# Patient Record
Sex: Male | Born: 1965
Health system: Southern US, Community
[De-identification: ages and names within clinical notes are randomized; demographics above are authoritative.]

## PROBLEM LIST (undated history)

## (undated) DIAGNOSIS — M109 Gout, unspecified: Secondary | ICD-10-CM

## (undated) HISTORY — PX: TONSILLECTOMY: SUR1361

## (undated) HISTORY — PX: TOOTH EXTRACTION: SUR596

---

## 2001-06-05 ENCOUNTER — Encounter (HOSPITAL_COMMUNITY): Admission: RE | Admit: 2001-06-05 | Discharge: 2001-07-05 | Payer: Self-pay | Admitting: Family Medicine

## 2001-07-14 ENCOUNTER — Encounter (HOSPITAL_COMMUNITY): Admission: RE | Admit: 2001-07-14 | Discharge: 2001-08-13 | Payer: Self-pay | Admitting: Family Medicine

## 2010-08-27 ENCOUNTER — Ambulatory Visit (INDEPENDENT_AMBULATORY_CARE_PROVIDER_SITE_OTHER): Payer: BC Managed Care – PPO | Admitting: Internal Medicine

## 2010-08-27 DIAGNOSIS — K625 Hemorrhage of anus and rectum: Secondary | ICD-10-CM

## 2010-09-10 NOTE — Consult Note (Signed)
[name], [name]                ACCOUNT NO.:  [id number]  MEDICAL RECORD NO.:  [id number]           PATIENT TYPE:  LOCATION: [facility]      FACILITY:  PHYSICIAN:  Lionel December, M.D.         DATE OF BIRTH:  DATE OF CONSULTATION: DATE OF DISCHARGE:                                CONSULTATION   REASON FOR CONSULTATION:  Blood in stool.  HISTORY OF PRESENT ILLNESS:  Michael Pruitt is a 45 year old male, referred to our office by Charlann Lange, PA-C at [facility] for blood in stool. He was seen at [facility] on February 29.  He had had rectal bleeding for 3-5 days.  He noticed blood in the toilet which was red. He was started on ProctoCream 2.5% twice a day.  When seen in the office in February, he was guaiac negative.  After starting the medicine, he had not had any rectal bleeding until 4 days ago.  He saw blood in the toilet tissue then.  He denies any heavy lifting.  He has been on a tractor recently.  He usually has a bowel movement 1-4 a day.  His stools are brown in color, normal size.  He continues to use ProctoFoam once a day now.  He denies any rectal fullness. There had been no abdominal pain.  No nausea or vomiting.  No weight loss.  His appetite is good.  No constipation or diarrhea.  There is no family history of colon cancer.  HOME MEDICATIONS: 1. ProctoCream 2.5% once a day. 2. Aleve as needed.  ALLERGIES:  No known allergies.  SURGERIES:  He had a tonsillectomy when he was in the third grade.  MEDICAL PROBLEMS:  Hypertension and high cholesterol.  His mother and father are alive in good health.  One sister in good health.  He is divorced.  He is self-employed as a Visual merchandiser.  He smokes 1- 2 packs a day x15 years.  He drinks 4-5 beers a day.  No children.  OBJECTIVE:  VITAL SIGNS:  His weight is 235, his height is 6 feet, temperature is 97.3, blood pressure is 122/80, pulse is 76. HEENT:  He has natural teeth.  His oral  mucosa is moist.  There are no lesions.  His conjunctivae is pink.  His sclera is anicteric. NECK:  His thyroid is normal.  There is no cervical lymphadenopathy. LUNGS:  Clear. HEART:  Regular rate and rhythm. ABDOMEN:  Obese.  Bowel sounds are positive.  No masses.  ASSESSMENT:  [name] is a 45 year old male, presenting today with a history of rectal bleeding.  He was guaiac negative today in the office. This could be from an internal hemorrhoid bleed, AVM, colonic polyp, or ulcer.  Also, a colonic carcinoma will be in the differential.  RECOMMENDATIONS:  We will schedule a colonoscopy with Dr. Karilyn Cota in the near future and the risk and benefits were reviewed with the patient and he is agreeable.    ______________________________ Dorene Ar, NP   ______________________________ Lionel December, M.D.    TS/MEDQ  D:  08/27/2010  T:  08/28/2010  Job:  [id number]  Electronically Signed by Dorene Ar PA on 09/04/2010 02:38:48 PM Electronically Signed by Lionel December M.D. on 09/09/2010  11:07:04 PM

## 2010-10-11 ENCOUNTER — Encounter (HOSPITAL_BASED_OUTPATIENT_CLINIC_OR_DEPARTMENT_OTHER): Payer: BC Managed Care – PPO | Admitting: Internal Medicine

## 2010-10-11 ENCOUNTER — Ambulatory Visit (HOSPITAL_COMMUNITY)
Admission: RE | Admit: 2010-10-11 | Discharge: 2010-10-11 | Disposition: A | Discharge status: Home / Self Care | Payer: BC Managed Care – PPO | Source: Ambulatory Visit | Attending: Internal Medicine | Admitting: Internal Medicine

## 2010-10-11 DIAGNOSIS — K644 Residual hemorrhoidal skin tags: Secondary | ICD-10-CM

## 2010-10-11 DIAGNOSIS — K5730 Diverticulosis of large intestine without perforation or abscess without bleeding: Secondary | ICD-10-CM | POA: Insufficient documentation

## 2010-10-11 DIAGNOSIS — K921 Melena: Secondary | ICD-10-CM

## 2010-10-11 DIAGNOSIS — Z79899 Other long term (current) drug therapy: Secondary | ICD-10-CM | POA: Insufficient documentation

## 2010-10-11 DIAGNOSIS — I10 Essential (primary) hypertension: Secondary | ICD-10-CM | POA: Insufficient documentation

## 2010-10-11 DIAGNOSIS — E785 Hyperlipidemia, unspecified: Secondary | ICD-10-CM | POA: Insufficient documentation

## 2010-10-28 NOTE — Op Note (Signed)
  NAMESAHAN, Michael Pruitt                ACCOUNT NO.:  [id number]  MEDICAL RECORD NO.:  [id number]           PATIENT TYPE:  O  LOCATION:  [facility]                          FACILITY:  [facility]  PHYSICIAN:  Lionel December, M.D.    DATE OF BIRTH:  Jul 22, 1965  DATE OF PROCEDURE:  10/11/2010 DATE OF DISCHARGE:                              OPERATIVE REPORT   PROCEDURE:  Colonoscopy.  INDICATIONS:  Michael Pruitt is a 45 year old Caucasian male who has been experiencing hematochezia intermittently over the last 3 months.  It is suspected that he may have hemorrhoids.  He is undergoing colonoscopy to be sure that he does not have polyp or other abnormalities to account for his hematochezia.  Procedure and risks were reviewed with the patient and informed consent was obtained.  MEDICATIONS FOR CONSCIOUS SEDATION: 1. Demerol 50 mg IV. 2. Versed 10 mg IV.  FINDINGS:  Procedure performed in endoscopy suite.  The patient's vital signs and O2 sats were monitored during the procedure and remained stable.  The patient was placed in left lateral recumbent position. Rectal examination performed.  No abnormality noted on external or digital exam.  Pentax videoscope was placed through rectum and advanced under vision into sigmoid colon and beyond.  Preparation was excellent. There were 3 small diverticula at sigmoid colon.  Scope was passed into cecum which was identified by appendiceal orifice and ileocecal valve. As the scope was withdrawn, colonic mucosa was carefully examined and was normal throughout.  Rectal mucosa was normal.  Scope was retroflexed to examine anorectal junction and hemorrhoids noted below the dentate line.  Endoscope was withdrawn.  Withdrawal time was 10 minutes.  The patient tolerated the procedure well.  FINAL DIAGNOSES: 1. Examination performed to cecum. 2. External hemorrhoids and 3 small diverticula at sigmoid colon.  RECOMMENDATIONS: 1. High-fiber diet. 2. The patient advised to keep  a written record of bleeding episodes     and is to call us with a progress report in 3 months.  If bleeding     episodes are frequent, he may benefit from Ultroid therapy.  In the     meantime, he can use ProctoCream on p.r.n. basis.          ______________________________ Lionel December, M.D.     NR/MEDQ  D:  10/11/2010  T:  10/12/2010  Job:  [id number]  cc:   Kirk Ruths, M.D. Fax: [phone]  Electronically Signed by Lionel December M.D. on 10/28/2010 01:13:17 PM

## 2012-10-01 ENCOUNTER — Emergency Department (HOSPITAL_COMMUNITY): Payer: BC Managed Care – PPO

## 2012-10-01 ENCOUNTER — Emergency Department (HOSPITAL_COMMUNITY)
Admission: EM | Admit: 2012-10-01 | Discharge: 2012-10-01 | Disposition: A | Discharge status: Home / Self Care | Payer: BC Managed Care – PPO | Attending: Emergency Medicine | Admitting: Emergency Medicine

## 2012-10-01 ENCOUNTER — Encounter (HOSPITAL_COMMUNITY): Payer: Self-pay | Admitting: Emergency Medicine

## 2012-10-01 DIAGNOSIS — S91009A Unspecified open wound, unspecified ankle, initial encounter: Secondary | ICD-10-CM | POA: Insufficient documentation

## 2012-10-01 DIAGNOSIS — W208XXA Other cause of strike by thrown, projected or falling object, initial encounter: Secondary | ICD-10-CM | POA: Insufficient documentation

## 2012-10-01 DIAGNOSIS — S81811A Laceration without foreign body, right lower leg, initial encounter: Secondary | ICD-10-CM

## 2012-10-01 DIAGNOSIS — S81831A Puncture wound without foreign body, right lower leg, initial encounter: Secondary | ICD-10-CM

## 2012-10-01 DIAGNOSIS — F17200 Nicotine dependence, unspecified, uncomplicated: Secondary | ICD-10-CM | POA: Insufficient documentation

## 2012-10-01 DIAGNOSIS — Z23 Encounter for immunization: Secondary | ICD-10-CM | POA: Insufficient documentation

## 2012-10-01 DIAGNOSIS — S81009A Unspecified open wound, unspecified knee, initial encounter: Secondary | ICD-10-CM | POA: Insufficient documentation

## 2012-10-01 DIAGNOSIS — Y929 Unspecified place or not applicable: Secondary | ICD-10-CM | POA: Insufficient documentation

## 2012-10-01 DIAGNOSIS — Y9279 Other farm location as the place of occurrence of the external cause: Secondary | ICD-10-CM | POA: Insufficient documentation

## 2012-10-01 DIAGNOSIS — Y9389 Activity, other specified: Secondary | ICD-10-CM | POA: Insufficient documentation

## 2012-10-01 MED ORDER — DOXYCYCLINE HYCLATE 100 MG PO TABS
100.0000 mg | ORAL_TABLET | Freq: Two times a day (BID) | ORAL | Status: DC
  Administered 2012-10-01: 100 mg via ORAL
  Filled 2012-10-01: qty 1

## 2012-10-01 MED ORDER — MORPHINE SULFATE 10 MG/ML IJ SOLN
INTRAMUSCULAR | Status: AC
  Administered 2012-10-01: 10 mg
  Filled 2012-10-01: qty 1

## 2012-10-01 MED ORDER — DOXYCYCLINE HYCLATE 100 MG PO CAPS: 100.0000 mg | ORAL_CAPSULE | Freq: Two times a day (BID) | ORAL | Status: DC

## 2012-10-01 MED ORDER — HYDROCODONE-ACETAMINOPHEN 7.5-325 MG PO TABS: 1.0000 | ORAL_TABLET | ORAL | Status: DC | PRN

## 2012-10-01 MED ORDER — PENICILLIN V POTASSIUM 500 MG PO TABS: ORAL_TABLET | ORAL | Status: DC

## 2012-10-01 MED ORDER — BUPIVACAINE HCL (PF) 0.25 % IJ SOLN
INTRAMUSCULAR | Status: AC
  Administered 2012-10-01: 21:00:00
  Filled 2012-10-01: qty 30

## 2012-10-01 MED ORDER — TETANUS-DIPHTH-ACELL PERTUSSIS 5-2.5-18.5 LF-MCG/0.5 IM SUSP
0.5000 mL | Freq: Once | INTRAMUSCULAR | Status: AC
  Administered 2012-10-01: 0.5 mL via INTRAMUSCULAR
  Filled 2012-10-01: qty 0.5

## 2012-10-01 MED ORDER — PENICILLIN V POTASSIUM 250 MG PO TABS
500.0000 mg | ORAL_TABLET | Freq: Once | ORAL | Status: AC
  Administered 2012-10-01: 500 mg via ORAL
  Filled 2012-10-01: qty 2

## 2012-10-01 MED ORDER — MORPHINE SULFATE 2 MG/ML IJ SOLN: 8.0000 mg | Freq: Once | INTRAMUSCULAR | Status: DC

## 2012-10-01 MED ORDER — PROMETHAZINE HCL 12.5 MG PO TABS
12.5000 mg | ORAL_TABLET | Freq: Once | ORAL | Status: AC
  Administered 2012-10-01: 12.5 mg via ORAL
  Filled 2012-10-01: qty 1

## 2012-10-01 NOTE — ED Provider Notes (Signed)
History     CSN: [id number]  Arrival date & time 10/01/12  0865   First MD Initiated Contact with Patient 10/01/12 2010      Chief Complaint  Patient presents with  . Leg Pain  . Laceration    (Consider location/radiation/quality/duration/timing/severity/associated sxs/prior treatment) Patient is a 47 y.o. male presenting with skin laceration.  Laceration Location:  Leg Leg laceration location:  R lower leg Quality: jagged   Bleeding: controlled   Time since incident:  2 hours Laceration mechanism:  Metal edge (puncture and laceration) Pain details:    Quality:  Sharp   Severity:  Moderate   Timing:  Constant   Progression:  Unchanged Foreign body present:  No foreign bodies Relieved by:  None tried Exacerbated by: standing or applying weight. Tetanus status:  Out of date   History reviewed. No pertinent past medical history.  History reviewed. No pertinent past surgical history.  No family history on file.  History  Substance Use Topics  . Smoking status: Current Every Day Smoker    Types: Cigarettes  . Smokeless tobacco: Not on file  . Alcohol Use: Yes      Review of Systems  Constitutional: Negative for activity change.       All ROS Neg except as noted in HPI  HENT: Negative for nosebleeds and neck pain.   Eyes: Negative for photophobia and discharge.  Respiratory: Negative for cough, shortness of breath and wheezing.   Cardiovascular: Negative for chest pain and palpitations.  Gastrointestinal: Negative for abdominal pain and blood in stool.  Genitourinary: Negative for dysuria, frequency and hematuria.  Musculoskeletal: Positive for arthralgias. Negative for back pain.  Skin: Negative.   Neurological: Negative for dizziness, seizures and speech difficulty.  Psychiatric/Behavioral: Negative for hallucinations and confusion.    Allergies  Review of patient's allergies indicates no known allergies.  Home Medications   Current Outpatient Rx   Name  Route  Sig  Dispense  Refill  . naproxen sodium (ALEVE) 220 MG tablet   Oral   Take 440 mg by mouth once as needed (for headache pain).           BP 139/90  Pulse 96  Temp(Src) 97.6 F (36.4 C) (Oral)  Resp 16  Ht 5\' 11"  (1.803 m)  Wt 220 lb (99.791 kg)  BMI 30.7 kg/m2  SpO2 97%  Physical Exam  Nursing note and vitals reviewed. Constitutional: He is oriented to person, place, and time. He appears well-developed and well-nourished.  Non-toxic appearance.  HENT:  Head: Normocephalic.  Right Ear: Tympanic membrane and external ear normal.  Left Ear: Tympanic membrane and external ear normal.  Eyes: EOM and lids are normal. Pupils are equal, round, and reactive to light.  Neck: Normal range of motion. Neck supple. Carotid bruit is not present.  Cardiovascular: Normal rate, regular rhythm, normal heart sounds, intact distal pulses and normal pulses.   Pulmonary/Chest: Breath sounds normal. No respiratory distress.  Abdominal: Soft. Bowel sounds are normal. There is no tenderness. There is no guarding.  Musculoskeletal: Normal range of motion.  Patient has full range of motion of the right hip and knee. There are 2 deep lacerations to the medial aspect of the right lower extremity. The dorsalis pedis and posterior tibial pulses are 2+. The capillary refill of the toes of the right foot are less than 3 seconds. Is full range of motion of the ankle and toes of the right lower extremity.  Lymphadenopathy:  Head (right side): No submandibular adenopathy present.       Head (left side): No submandibular adenopathy present.    He has no cervical adenopathy.  Neurological: He is alert and oriented to person, place, and time. He has normal strength. No cranial nerve deficit or sensory deficit. He exhibits normal muscle tone. Coordination normal.  Skin: Skin is warm and dry.  Psychiatric: He has a normal mood and affect. His speech is normal.    ED Course  Procedures :REPAIR  OF LACERATION #1 RIGHT LOWER LEG - patient identified by arm band. Permission for procedure given by the patient. Procedural time out taken before repair of laceration number one of the right lower extremity. The procedure was explained to the patient in terms which he understood. The wound was cleansed with Betadine surgical brush. It was irrigated with sterile saline. Following this the wound was draped in sterile fashion and using sterile technique the subcutaneous area was repaired with 4 interrupted sutures of 4-0 chromic. The skin was repaired with 10 staples. The wound measures 4 cm.  REPAIR OF LACERATION #2 RIGHT LOWER LEG - patient identified by arm band. Permission for procedure given by the patient. Procedural time out taken before repair of the second laceration of the lower extremity on the right. The procedure was explained to the patient in terms which he understood. The wound was cleansed with a Betadine surgical brush. It was irrigated with sterile saline. The wound was investigated and no foreign body appreciated. Using sterile technique the subcutaneous area was repaired with 4 interrupted sutures of 4-0 chromic. The skin was repaired with 9 staples. The wound measured 3.8 cm.  Patient tolerated procedures without problem.  Labs Reviewed - No data to display Dg Tibia/fibula Right  10/01/2012  *RADIOLOGY REPORT*  Clinical Data: Blunt trauma  RIGHT TIBIA AND FIBULA - 2 VIEW  Comparison: None.  Findings: There is a skin defect / muscle defect within the wall proximal left lower extremity adjacent to the tibia.  There is no radiodense foreign body.  No evidence of fracture.  IMPRESSION: Soft tissue injury to the proximal calf without evidence of osseous injury.   Original Report Authenticated By: Genevive Bi, M.D.      No diagnosis found.    MDM  I have reviewed nursing notes, vital signs, and all appropriate lab and imaging results for this patient. Patient was working with a  piece of farm equipment that fell the wrong direction and injured his right lower leg. He has one laceration/puncture wound, he has a second laceration to the right lower leg. These areas were irrigated and then repaired without problem. The patient's tetanus status was updated. At the time of discharge he is rechecked, and he is neurovascularly intact.  The patient is treated with doxycycline 100 mg twice a day, as well as penicillin twice a day. Patient is given Norco 7.5 mg #20 tablets for pain. The patient will have the staples removed in 10 days. He is to return to the emergency department sooner if any changes, problems, or concerns, or signs of infection.       Kathie Dike, PA-C 10/02/12 Ernestina Columbia

## 2012-10-01 NOTE — ED Notes (Addendum)
Pt states a piece of farm equipment fell on the rt leg. Bleeding controlled at this time. Last tetanus was in 2005 Pt has 2 puncture wounds of the rt leg

## 2012-10-01 NOTE — ED Notes (Signed)
nad noted prior to dc. Dc instructions reviewed and pt voiced understanding. drsg was applied. 3 scripts given to pt. Ambulated out without difficulty. Driver with pt

## 2012-10-03 NOTE — ED Provider Notes (Signed)
Medical screening examination/treatment/procedure(s) were performed by non-physician practitioner and as supervising physician I was immediately available for consultation/collaboration. Lenola Lockner, MD, FACEP   Blue Winther L Hevin Jeffcoat, MD 10/03/12 0715 

## 2015-01-19 ENCOUNTER — Other Ambulatory Visit (HOSPITAL_COMMUNITY): Payer: Self-pay | Admitting: Physician Assistant

## 2015-01-19 DIAGNOSIS — N63 Unspecified lump in unspecified breast: Secondary | ICD-10-CM

## 2015-01-24 ENCOUNTER — Ambulatory Visit (HOSPITAL_COMMUNITY)
Admission: RE | Admit: 2015-01-24 | Discharge: 2015-01-24 | Disposition: A | Discharge status: Home / Self Care | Payer: BLUE CROSS/BLUE SHIELD | Source: Ambulatory Visit | Attending: Physician Assistant | Admitting: Physician Assistant

## 2015-01-24 DIAGNOSIS — N63 Unspecified lump in unspecified breast: Secondary | ICD-10-CM

## 2015-01-24 DIAGNOSIS — N62 Hypertrophy of breast: Secondary | ICD-10-CM | POA: Insufficient documentation

## 2015-01-24 DIAGNOSIS — N644 Mastodynia: Secondary | ICD-10-CM | POA: Insufficient documentation

## 2016-05-03 DIAGNOSIS — L300 Nummular dermatitis: Secondary | ICD-10-CM | POA: Diagnosis not present

## 2016-05-03 DIAGNOSIS — L570 Actinic keratosis: Secondary | ICD-10-CM | POA: Diagnosis not present

## 2016-12-12 DIAGNOSIS — Z1389 Encounter for screening for other disorder: Secondary | ICD-10-CM | POA: Diagnosis not present

## 2016-12-12 DIAGNOSIS — M1000 Idiopathic gout, unspecified site: Secondary | ICD-10-CM | POA: Diagnosis not present

## 2016-12-12 DIAGNOSIS — Z6832 Body mass index (BMI) 32.0-32.9, adult: Secondary | ICD-10-CM | POA: Diagnosis not present

## 2016-12-24 DIAGNOSIS — Z1389 Encounter for screening for other disorder: Secondary | ICD-10-CM | POA: Diagnosis not present

## 2016-12-24 DIAGNOSIS — Z0000 Encounter for general adult medical examination without abnormal findings: Secondary | ICD-10-CM | POA: Diagnosis not present

## 2016-12-24 DIAGNOSIS — R7309 Other abnormal glucose: Secondary | ICD-10-CM | POA: Diagnosis not present

## 2016-12-24 DIAGNOSIS — E6609 Other obesity due to excess calories: Secondary | ICD-10-CM | POA: Diagnosis not present

## 2016-12-24 DIAGNOSIS — Z719 Counseling, unspecified: Secondary | ICD-10-CM | POA: Diagnosis not present

## 2016-12-24 DIAGNOSIS — Z6832 Body mass index (BMI) 32.0-32.9, adult: Secondary | ICD-10-CM | POA: Diagnosis not present

## 2017-03-04 DIAGNOSIS — H524 Presbyopia: Secondary | ICD-10-CM | POA: Diagnosis not present

## 2017-03-06 ENCOUNTER — Encounter: Payer: Self-pay | Admitting: *Deleted

## 2017-03-06 ENCOUNTER — Emergency Department: Payer: BLUE CROSS/BLUE SHIELD

## 2017-03-06 ENCOUNTER — Emergency Department
Admission: EM | Admit: 2017-03-06 | Discharge: 2017-03-06 | Disposition: A | Discharge status: Home / Self Care | Payer: BLUE CROSS/BLUE SHIELD | Attending: Emergency Medicine | Admitting: Emergency Medicine

## 2017-03-06 DIAGNOSIS — F17210 Nicotine dependence, cigarettes, uncomplicated: Secondary | ICD-10-CM | POA: Diagnosis not present

## 2017-03-06 DIAGNOSIS — Z79899 Other long term (current) drug therapy: Secondary | ICD-10-CM | POA: Diagnosis not present

## 2017-03-06 DIAGNOSIS — M79604 Pain in right leg: Secondary | ICD-10-CM | POA: Insufficient documentation

## 2017-03-06 DIAGNOSIS — R600 Localized edema: Secondary | ICD-10-CM | POA: Diagnosis not present

## 2017-03-06 HISTORY — DX: Gout, unspecified: M10.9

## 2017-03-06 LAB — CBC WITH DIFFERENTIAL/PLATELET
Basophils Absolute: 0.1 10*3/uL (ref 0–0.1)
Basophils Relative: 1 %
Eosinophils Absolute: 0.3 10*3/uL (ref 0–0.7)
Eosinophils Relative: 3 %
HCT: 45 % (ref 40.0–52.0)
Hemoglobin: 15.7 g/dL (ref 13.0–18.0)
Lymphocytes Relative: 26 %
Lymphs Abs: 2.7 10*3/uL (ref 1.0–3.6)
MCH: 31.6 pg (ref 26.0–34.0)
MCHC: 34.9 g/dL (ref 32.0–36.0)
MCV: 90.5 fL (ref 80.0–100.0)
Monocytes Absolute: 0.6 10*3/uL (ref 0.2–1.0)
Monocytes Relative: 6 %
Neutro Abs: 6.4 10*3/uL (ref 1.4–6.5)
Neutrophils Relative %: 64 %
Platelets: 229 10*3/uL (ref 150–440)
RBC: 4.97 MIL/uL (ref 4.40–5.90)
RDW: 13.5 % (ref 11.5–14.5)
WBC: 10.1 10*3/uL (ref 3.8–10.6)

## 2017-03-06 LAB — COMPREHENSIVE METABOLIC PANEL
ALT: 45 U/L (ref 17–63)
AST: 29 U/L (ref 15–41)
Albumin: 3.9 g/dL (ref 3.5–5.0)
Alkaline Phosphatase: 71 U/L (ref 38–126)
Anion gap: 8 (ref 5–15)
BUN: 13 mg/dL (ref 6–20)
CO2: 22 mmol/L (ref 22–32)
Calcium: 8.6 mg/dL — ABNORMAL LOW (ref 8.9–10.3)
Chloride: 105 mmol/L (ref 101–111)
Creatinine, Ser: 0.89 mg/dL (ref 0.61–1.24)
GFR calc Af Amer: >60 mL/min (ref >60)
GFR calc non Af Amer: >60 mL/min (ref >60)
Glucose, Bld: 100 mg/dL — ABNORMAL HIGH (ref 65–99)
Potassium: 4.2 mmol/L (ref 3.5–5.1)
Sodium: 135 mmol/L (ref 135–145)
Total Bilirubin: 0.6 mg/dL (ref 0.3–1.2)
Total Protein: 6.9 g/dL (ref 6.5–8.1)

## 2017-03-06 LAB — PROTIME-INR
INR: 0.9
Prothrombin Time: 12.1 seconds (ref 11.4–15.2)

## 2017-03-06 LAB — URIC ACID: Uric Acid, Serum: 6.4 mg/dL (ref 4.4–7.6)

## 2017-03-06 NOTE — ED Provider Notes (Signed)
[facility] Emergency Department Provider Note  ____________________________________________  Time seen: Approximately 3:26 PM  I have reviewed the triage vital signs and the nursing notes.   HISTORY  Chief Complaint Leg Pain    HPI Michael Pruitt is a 51 y.o. male presents to the emergency department with right lower extremity pain that has worsened in intensity for the past 3 days. Patient describes his pain as a dull ache along the posterior knee and along the anterior right  Shank. Patient has also noticed unilateral right lower extremity edema. Patient is a daily smoker. He denies shortness of breath, recent travel, prolonged immobility, recent surgery, recent weight loss or weight gain, knowledge of recurrent malignancy or chest tightness. Patient has never had a DVT in the past. No alleviating measures have been attempted.    Past Medical History:  Diagnosis Date  . Gout     There are no active problems to display for this patient.   History reviewed. No pertinent surgical history.  Prior to Admission medications   Medication Sig Start Date End Date Taking? Authorizing Provider  doxycycline (VIBRAMYCIN) 100 MG capsule Take 1 capsule (100 mg total) by mouth 2 (two) times daily. 10/01/12   Ivery Quale, PA-C  HYDROcodone-acetaminophen (NORCO) 7.5-325 MG per tablet Take 1 tablet by mouth every 4 (four) hours as needed for pain. 10/01/12   Ivery Quale, PA-C  naproxen sodium (ALEVE) 220 MG tablet Take 440 mg by mouth once as needed (for headache pain).    [provider]  penicillin v potassium (VEETID) 500 MG tablet 2 PO BID WITH FOOD 10/01/12   Ivery Quale, PA-C    Allergies Aleve [naproxen sodium]  No family history on file.  Social History Social History  Substance Use Topics  . Smoking status: Current Every Day Smoker    Types: Cigarettes  . Smokeless tobacco: Never Used  . Alcohol use Yes     Review of Systems   Constitutional: No fever/chills Eyes: No visual changes. No discharge ENT: No upper respiratory complaints. Cardiovascular: no chest pain. Respiratory: no cough. No SOB. Musculoskeletal: Patient has right lower extremity pain.  Skin: Negative for rash, abrasions, lacerations, ecchymosis. Neurological: Negative for headaches, focal weakness or numbness.   ____________________________________________   PHYSICAL EXAM:  VITAL SIGNS: ED Triage Vitals  Enc Vitals Group     BP 03/06/17 1452 133/76     Pulse Rate 03/06/17 1452 (!) 102     Resp 03/06/17 1452 16     Temp 03/06/17 1452 98.2 F (36.8 C)     Temp Source 03/06/17 1452 Oral     SpO2 03/06/17 1452 99 %     Weight 03/06/17 1453 228 lb (103.4 kg)     Height 03/06/17 1453  (1.803 m)     Head Circumference --      Peak Flow --      Pain Score 03/06/17 1451 6     Pain Loc --      Pain Edu? --      Excl. in GC? --      Constitutional: Alert and oriented. Well appearing and in no acute distress. Eyes: Conjunctivae are normal. PERRL. EOMI. Head: Atraumatic. Cardiovascular: Normal rate, regular rhythm. Normal S1 and S2.  Good peripheral circulation. Respiratory: Normal respiratory effort without tachypnea or retractions. Lungs CTAB. Good air entry to the bases with no decreased or absent breath sounds. Musculoskeletal: Patient has 5 out of 5 strength in the lower extremities bilaterally.  Patient performs full range of motion at the right hip, right knee and right ankle. Patient has palpable veins along the posterior right knee and along the anterior shank. No pain was elicited with palpation of the calf. Palpable dorsalis pedis pulse, right  Neurologic:  Normal speech and language. No gross focal neurologic deficits are appreciated.  Skin:  Skin is warm, dry and intact. No rash noted.  Psychiatric: Mood and affect are normal. Speech and behavior are normal. Patient exhibits appropriate insight and  judgement.   ____________________________________________   LABS (all labs ordered are listed, but only abnormal results are displayed)  Labs Reviewed  COMPREHENSIVE METABOLIC PANEL - Abnormal; Notable for the following:       Result Value   Glucose, Bld 100 (*)    Calcium 8.6 (*)    All other components within normal limits  CBC WITH DIFFERENTIAL/PLATELET  PROTIME-INR  URIC ACID   ____________________________________________  EKG   ____________________________________________  RADIOLOGY Geraldo Pitter, personally viewed and evaluated these images as part of my medical decision making, as well as reviewing the written report by the radiologist.   US Venous Img Lower Unilateral Right  Result Date: 03/06/2017 CLINICAL DATA:  51 year old male with right lower extremity pain and edema for 3-4 days. Chronic varicose veins. EXAM: RIGHT LOWER EXTREMITY VENOUS DOPPLER ULTRASOUND TECHNIQUE: Gray-scale sonography with graded compression, as well as color Doppler and duplex ultrasound were performed to evaluate the lower extremity deep venous systems from the level of the common femoral vein and including the common femoral, femoral, profunda femoral, popliteal and calf veins including the posterior tibial, peroneal and gastrocnemius veins when visible. The superficial great saphenous vein was also interrogated. Spectral Doppler was utilized to evaluate flow at rest and with distal augmentation maneuvers in the common femoral, femoral and popliteal veins. COMPARISON:  Right tib-fib radiographs 10/01/2012. FINDINGS: Contralateral Common Femoral Vein: Respiratory phasicity is normal and symmetric with the symptomatic side. No evidence of thrombus. Normal compressibility. Common Femoral Vein: No evidence of thrombus. Normal compressibility, respiratory phasicity and response to augmentation. Saphenofemoral Junction: No evidence of thrombus. Normal compressibility and flow on color Doppler  imaging. Profunda Femoral Vein: No evidence of thrombus. Normal compressibility and flow on color Doppler imaging. Femoral Vein: No evidence of thrombus. Normal compressibility, respiratory phasicity and response to augmentation. Popliteal Vein: No evidence of thrombus. Normal compressibility, respiratory phasicity and response to augmentation. Calf Veins: No evidence of thrombus. Normal compressibility and flow on color Doppler imaging. Superficial Great Saphenous Vein: No evidence of thrombus. Normal compressibility. Venous Reflux:  None. Other Findings:  None. IMPRESSION: No evidence of right lower extremity deep venous thrombosis. Electronically Signed   By: Odessa Fleming M.D.   On: 03/06/2017 16:16    ____________________________________________    PROCEDURES  Procedure(s) performed:    Procedures    Medications - No data to display   ____________________________________________   INITIAL IMPRESSION / ASSESSMENT AND PLAN / ED COURSE  Pertinent labs & imaging results that were available during my care of the patient were reviewed by me and considered in my medical decision making (see chart for details).  Review of the [place] CSRS was performed in accordance of the [facility] prior to dispensing any controlled drugs.     Assessment and Plan:  Right lower extremity pain Differential diagnosis included thromboembolism versus venous insufficiency. Patient presented to the emergency department with right lower extremity pain, edema and palpable superficial veins. Ultrasound examination was noncontributory for thromboembolism. Venous insufficiency  is likely given history and physical exam findings. Compression stockings were recommended as well as daily walking to strength calf muscles. A referral was made to vascular. Vital signs are reassuring prior to discharge. All patient questions were answered.    ____________________________________________  FINAL CLINICAL IMPRESSION(S) / ED  DIAGNOSES  Final diagnoses:  Right leg pain      NEW MEDICATIONS STARTED DURING THIS VISIT:  New Prescriptions   No medications on file        This chart was dictated using voice recognition software/Dragon. Despite best efforts to proofread, errors can occur which can change the meaning. Any change was purely unintentional.    Orvil Feil, PA-C 03/06/17 1713    Rockne Menghini, MD 03/06/17 Windell Moment

## 2017-03-06 NOTE — ED Notes (Signed)
Pt c/o right lower leg pain that started 2 weeks ago. Pt has a hx of injuring his right leg four years ago while at work. Pt has 6/10 pain.

## 2017-03-06 NOTE — ED Triage Notes (Signed)
Right lower leg pain.  Had injury a long time ago and has chronic pain, but got worse a couple weeks ago

## 2017-03-10 ENCOUNTER — Encounter (INDEPENDENT_AMBULATORY_CARE_PROVIDER_SITE_OTHER): Payer: Self-pay | Admitting: Vascular Surgery

## 2017-03-10 ENCOUNTER — Ambulatory Visit (INDEPENDENT_AMBULATORY_CARE_PROVIDER_SITE_OTHER): Payer: BLUE CROSS/BLUE SHIELD | Admitting: Vascular Surgery

## 2017-03-10 VITALS — BP 147/93 | HR 104 | Resp 17 | Ht 71.0 in | Wt 240.4 lb

## 2017-03-10 DIAGNOSIS — F17200 Nicotine dependence, unspecified, uncomplicated: Secondary | ICD-10-CM | POA: Diagnosis not present

## 2017-03-10 DIAGNOSIS — I83813 Varicose veins of bilateral lower extremities with pain: Secondary | ICD-10-CM | POA: Diagnosis not present

## 2017-03-10 NOTE — Progress Notes (Signed)
Subjective:    Patient ID: Michael Pruitt, male    DOB: 1965/11/20, 51 y.o.   MRN: [id number] Chief Complaint  Patient presents with  . Leg Pain    pt was seen at the ER   Presents as a new patient referred by the emergency department. Patient was seen on 03/06/2017 for right lower extremity pain along his varicosities at [facility] ED. He was ruled out for a DVT and referred to our practice for further evaluation. The patient reports worsening pain along his right lower extremity varicosities since February of this year. He endorses trauma to the medial aspect of his right knee a few years back. Patient notes the pain along his varicosities worsen towards the end of the day. He notes mild edema to the bilateral lower extremity. This also seems to worsen towards the end of the day. This edema is noted with discomfort. The patient has started to wear medical grade one compression stockings, engage in elevation and remain active since his visit to the emergency department. The patient's pain along his varicosities have worsened to the point he is unable to function on a daily basis. His right extremity is more affected than his left. He denies any claudication-like symptoms, rest pain or ulceration to the lower extremity. He denies any DVT to the lower extremity. He denies any fever, nausea or vomiting.    Review of Systems  Constitutional: Negative.   HENT: Negative.   Eyes: Negative.   Respiratory: Negative.   Cardiovascular: Positive for leg swelling.       Painful varicose veins  Gastrointestinal: Negative.   Endocrine: Negative.   Genitourinary: Negative.   Musculoskeletal: Negative.   Skin: Negative.   Allergic/Immunologic: Negative.   Neurological: Negative.   Hematological: Negative.   Psychiatric/Behavioral: Negative.       Objective:   Physical Exam  Constitutional: He is oriented to person, place, and time. He appears well-developed and well-nourished.  No distress.  HENT:  Head: Normocephalic and atraumatic.  Eyes: Pupils are equal, round, and reactive to light. Conjunctivae are normal.  Neck: Normal range of motion.  Cardiovascular: Normal rate, regular rhythm, normal heart sounds and intact distal pulses.   Pulses:      Radial pulses are 2+ on the right side, and 2+ on the left side.       Dorsalis pedis pulses are 2+ on the right side, and 2+ on the left side.       Posterior tibial pulses are 2+ on the right side, and 2+ on the left side.  Pulmonary/Chest: Effort normal.  Musculoskeletal: Normal range of motion. He exhibits no edema.  Neurological: He is alert and oriented to person, place, and time.  Skin: Skin is warm and dry. He is not diaphoretic.  Right lower extremity: >1cm varicosities to the right lower extremity. Left lower extremity: less than 1 cm varicosities Mild stasis dermatitis bilaterally Cellulitis noted Skin is intact  Psychiatric: He has a normal mood and affect. His behavior is normal. Judgment and thought content normal.  Vitals reviewed.  BP (!) 147/93 (BP Location: Right Arm)   Pulse (!) 104   Resp 17   Ht  (1.803 m)   Wt 240 lb 6.4 oz (109 kg)   BMI 33.53 kg/m   Past Medical History:  Diagnosis Date  . Gout    Social History   Social History  . Marital status: Single    Spouse name: N/A  .  Number of children: N/A  . Years of education: N/A   Occupational History  . Not on file.   Social History Main Topics  . Smoking status: Current Every Day Smoker    Types: Cigarettes  . Smokeless tobacco: Never Used  . Alcohol use Yes  . Drug use: No  . Sexual activity: Not on file   Other Topics Concern  . Not on file   Social History Narrative  . No narrative on file   Past Surgical History:  Procedure Laterality Date  . TONSILLECTOMY    . TOOTH EXTRACTION     Family History  Problem Relation Age of Onset  . Cancer Mother   . Asthma Father    Allergies  Allergen Reactions    . Aleve [Naproxen Sodium] Itching      Assessment & Plan:  Presents as a new patient referred by the emergency department. Patient was seen on 03/06/2017 for right lower extremity pain along his varicosities at [facility] ED. He was ruled out for a DVT and referred to our practice for further evaluation. The patient reports worsening pain along his right lower extremity varicosities since February of this year. He endorses trauma to the medial aspect of his right knee a few years back. Patient notes the pain along his varicosities worsen towards the end of the day. He notes mild edema to the bilateral lower extremity. This also seems to worsen towards the end of the day. This edema is noted with discomfort. The patient has started to wear medical grade one compression stockings, engage in elevation and remain active since his visit to the emergency department. The patient's pain along his varicosities have worsened to the point he is unable to function on a daily basis. His right extremity is more affected than his left. He denies any claudication-like symptoms, rest pain or ulceration to the lower extremity. He denies any DVT to the lower extremity. He denies any fever, nausea or vomiting.   1. Varicose veins of both lower extremities - New  Patient with great peripheral pulses to the bilateral lower extremity. Patient has started to engage in wearing medical grade 1 compression stockings, elevating his lower extremity and remaining active since his initial visit to [facility] on 03/06/2017 Over-the-counter anti-inflammatories for pain Patient to continue engaging in conservative therapy and will follow up in 3 months to assess his progress  2. Tobacco use disorder - stable We had a discussion for approximately 10 minutes regarding the absolute need for smoking cessation due to the deleterious nature of tobacco on the vascular system. We discussed the  tobacco use would diminish patency of any intervention, and likely significantly worsen progressio of disease. We discussed multiple agents for quitting including replacement therapy or medications to reduce cravings such as Chantix. The patient voices their understanding of the importance of smoking cessation.  Current Outpatient Prescriptions on File Prior to Visit  Medication Sig Dispense Refill  . doxycycline (VIBRAMYCIN) 100 MG capsule Take 1 capsule (100 mg total) by mouth 2 (two) times daily. (Patient not taking: Reported on 03/10/2017) 14 capsule 0  . HYDROcodone-acetaminophen (NORCO) 7.5-325 MG per tablet Take 1 tablet by mouth every 4 (four) hours as needed for pain. (Patient not taking: Reported on 03/10/2017) 20 tablet 0  . naproxen sodium (ALEVE) 220 MG tablet Take 440 mg by mouth once as needed (for headache pain).    Marland Kitchen penicillin v potassium (VEETID) 500 MG tablet 2 PO BID  WITH FOOD (Patient not taking: Reported on 03/10/2017) 28 tablet 0   No current facility-administered medications on file prior to visit.     There are no Patient Instructions on file for this visit. No Follow-up on file.   Emon Lance A Sequan Auxier, PA-C

## 2017-03-19 DIAGNOSIS — Z1389 Encounter for screening for other disorder: Secondary | ICD-10-CM | POA: Diagnosis not present

## 2017-03-19 DIAGNOSIS — M79604 Pain in right leg: Secondary | ICD-10-CM | POA: Diagnosis not present

## 2017-03-19 DIAGNOSIS — R209 Unspecified disturbances of skin sensation: Secondary | ICD-10-CM | POA: Diagnosis not present

## 2017-03-19 DIAGNOSIS — Z6833 Body mass index (BMI) 33.0-33.9, adult: Secondary | ICD-10-CM | POA: Diagnosis not present

## 2017-03-19 DIAGNOSIS — E6609 Other obesity due to excess calories: Secondary | ICD-10-CM | POA: Diagnosis not present

## 2017-03-24 ENCOUNTER — Ambulatory Visit (INDEPENDENT_AMBULATORY_CARE_PROVIDER_SITE_OTHER): Payer: BLUE CROSS/BLUE SHIELD | Admitting: Vascular Surgery

## 2017-03-24 ENCOUNTER — Encounter (INDEPENDENT_AMBULATORY_CARE_PROVIDER_SITE_OTHER): Payer: Self-pay | Admitting: Vascular Surgery

## 2017-03-24 ENCOUNTER — Ambulatory Visit (INDEPENDENT_AMBULATORY_CARE_PROVIDER_SITE_OTHER): Payer: BLUE CROSS/BLUE SHIELD

## 2017-03-24 VITALS — BP 137/91 | HR 94 | Resp 17

## 2017-03-24 DIAGNOSIS — F17200 Nicotine dependence, unspecified, uncomplicated: Secondary | ICD-10-CM

## 2017-03-24 DIAGNOSIS — I872 Venous insufficiency (chronic) (peripheral): Secondary | ICD-10-CM | POA: Diagnosis not present

## 2017-03-24 DIAGNOSIS — I83813 Varicose veins of bilateral lower extremities with pain: Secondary | ICD-10-CM | POA: Diagnosis not present

## 2017-03-24 NOTE — Progress Notes (Signed)
Subjective:    Patient ID: Michael Pruitt, male    DOB: 04/02/1966, 51 y.o.   MRN: [id number] Chief Complaint  Patient presents with  . Follow-up    pt conv bi ven reflux   Patient presents to review vascular studies. The patient was last seen on 03/10/2017 for evaluation of painful varicose veins located to the right lower extremity. The patient's discomfort continues even though he has been engaging in conservative therapy for months. The patient wears medical grade 1 compression stockings, elevates his legs and remains active with minimal improvement in symptoms requiring over the counter use of anti-inflammatories with minimal relief. The patient's symptoms have progressed to the point he is unable to function on a daily basis. His discomfort interferes with his ability to work on a daily basis. The patient underwent a right lower extremity venous reflux exam which was notable for venous incompetence in the right great saphenous, common femoral and femoral veins. No evidence of deep or superficial vein thrombosis in the right lower extremity. Patient denies any fever, nausea or vomiting.   Review of Systems  Constitutional: Negative.   HENT: Negative.   Eyes: Negative.   Respiratory: Negative.   Cardiovascular: Positive for leg swelling.       Painful varicose veins located to the right lower extremity  Gastrointestinal: Negative.   Endocrine: Negative.   Genitourinary: Negative.   Musculoskeletal: Negative.   Skin: Negative.   Allergic/Immunologic: Negative.   Neurological: Negative.   Hematological: Negative.   Psychiatric/Behavioral: Negative.       Objective:   Physical Exam  Constitutional: He is oriented to person, place, and time. He appears well-developed and well-nourished. No distress.  HENT:  Head: Normocephalic and atraumatic.  Eyes: Pupils are equal, round, and reactive to light. Conjunctivae are normal.  Neck: Normal range of motion.  Cardiovascular: Normal  rate, regular rhythm, normal heart sounds and intact distal pulses.   Pulses:      Radial pulses are 2+ on the right side, and 2+ on the left side.       Dorsalis pedis pulses are 2+ on the right side, and 2+ on the left side.       Posterior tibial pulses are 2+ on the right side, and 2+ on the left side.  Pulmonary/Chest: Effort normal.  Musculoskeletal: Normal range of motion. He exhibits edema (Mild bilateral lower extremity edema).  Neurological: He is alert and oriented to person, place, and time.  Skin: Skin is warm and dry. He is not diaphoretic.  Right lower extremity: >1cm varicosities to the right lower extremity. Left lower extremity: less than 1 cm varicosities Mild stasis dermatitis bilaterally. There are no skin changes. Skin is intact.  Vitals reviewed.  BP (!) 137/91 (BP Location: Right Arm)   Pulse 94   Resp 17   Past Medical History:  Diagnosis Date  . Gout    Social History   Social History  . Marital status: Single    Spouse name: N/A  . Number of children: N/A  . Years of education: N/A   Occupational History  . Not on file.   Social History Main Topics  . Smoking status: Current Every Day Smoker    Types: Cigarettes  . Smokeless tobacco: Never Used  . Alcohol use Yes  . Drug use: No  . Sexual activity: Not on file   Other Topics Concern  . Not on file   Social History Narrative  . No narrative on file  Past Surgical History:  Procedure Laterality Date  . TONSILLECTOMY    . TOOTH EXTRACTION     Family History  Problem Relation Age of Onset  . Cancer Mother   . Asthma Father    Allergies  Allergen Reactions  . Aleve [Naproxen Sodium] Itching      Assessment & Plan:  Patient presents to review vascular studies. The patient was last seen on 03/10/2017 for evaluation of painful varicose veins located to the right lower extremity. The patient's discomfort continues even though he has been engaging in conservative therapy for months. The  patient wears medical grade 1 compression stockings, elevates his legs and remains active with minimal improvement in symptoms requiring over the counter use of anti-inflammatories with minimal relief. The patient's symptoms have progressed to the point he is unable to function on a daily basis. His discomfort interferes with his ability to work on a daily basis. The patient underwent a right lower extremity venous reflux exam which was notable for venous incompetence in the right great saphenous, common femoral and femoral veins. No evidence of deep or superficial vein thrombosis in the right lower extremity. Patient denies any fever, nausea or vomiting.  1. Chronic venous insufficiency - New Patient with venous reflux to the right greater saphenous vein on duplex today Patient with greater than 1 cm varicosities noted to the right lower extremity. Patient has failed conservative therapy. His symptoms have progressed to the point he is unable to function or even work on a daily basis due to his discomfort. Due to the patient's discomfort he would like me to apply to his insurance today in regard to endovenous laser ablation to the right lower extremity. The patient is likely to benefit from endovenous laser ablation. I have discussed the risks and benefits of the procedure. The risks primarily include DVT, recanalization, bleeding, infection, and inability to gain access. We will contact him with insurance approval  2. Varicose veins of both lower extremities with pain - Stable As above Continue conservative therapy at this point while we await insurance approval  3. Tobacco use disorder - Stable We had a discussion for approximately 5 minutes regarding the absolute need for smoking cessation due to the deleterious nature of tobacco on the vascular system. We discussed the tobacco use would diminish patency of any intervention, and likely significantly worsen progressio of disease. We discussed  multiple agents for quitting including replacement therapy or medications to reduce cravings such as Chantix. The patient voices their understanding of the importance of smoking cessation.  Current Outpatient Prescriptions on File Prior to Visit  Medication Sig Dispense Refill  . naproxen sodium (ALEVE) 220 MG tablet Take 440 mg by mouth once as needed (for headache pain).    Marland Kitchen. doxycycline (VIBRAMYCIN) 100 MG capsule Take 1 capsule (100 mg total) by mouth 2 (two) times daily. (Patient not taking: Reported on 03/10/2017) 14 capsule 0  . HYDROcodone-acetaminophen (NORCO) 7.5-325 MG per tablet Take 1 tablet by mouth every 4 (four) hours as needed for pain. (Patient not taking: Reported on 03/10/2017) 20 tablet 0  . penicillin v potassium (VEETID) 500 MG tablet 2 PO BID WITH FOOD (Patient not taking: Reported on 03/10/2017) 28 tablet 0   No current facility-administered medications on file prior to visit.     There are no Patient Instructions on file for this visit. No Follow-up on file.   Chancellor Vanderloop A Marylin Lathon, PA-C

## 2017-03-31 DIAGNOSIS — Z1389 Encounter for screening for other disorder: Secondary | ICD-10-CM | POA: Diagnosis not present

## 2017-03-31 DIAGNOSIS — M79604 Pain in right leg: Secondary | ICD-10-CM | POA: Diagnosis not present

## 2017-03-31 DIAGNOSIS — Z6834 Body mass index (BMI) 34.0-34.9, adult: Secondary | ICD-10-CM | POA: Diagnosis not present

## 2017-03-31 DIAGNOSIS — E6609 Other obesity due to excess calories: Secondary | ICD-10-CM | POA: Diagnosis not present

## 2017-03-31 DIAGNOSIS — I872 Venous insufficiency (chronic) (peripheral): Secondary | ICD-10-CM | POA: Diagnosis not present

## 2017-04-02 ENCOUNTER — Ambulatory Visit (INDEPENDENT_AMBULATORY_CARE_PROVIDER_SITE_OTHER): Payer: BLUE CROSS/BLUE SHIELD | Admitting: Vascular Surgery

## 2017-04-02 ENCOUNTER — Encounter (INDEPENDENT_AMBULATORY_CARE_PROVIDER_SITE_OTHER): Payer: BLUE CROSS/BLUE SHIELD

## 2017-05-02 DIAGNOSIS — L300 Nummular dermatitis: Secondary | ICD-10-CM | POA: Diagnosis not present

## 2017-05-02 DIAGNOSIS — L570 Actinic keratosis: Secondary | ICD-10-CM | POA: Diagnosis not present

## 2017-06-11 ENCOUNTER — Ambulatory Visit (INDEPENDENT_AMBULATORY_CARE_PROVIDER_SITE_OTHER): Payer: BLUE CROSS/BLUE SHIELD | Admitting: Vascular Surgery

## 2017-06-11 ENCOUNTER — Encounter (INDEPENDENT_AMBULATORY_CARE_PROVIDER_SITE_OTHER): Payer: Self-pay | Admitting: Vascular Surgery

## 2017-06-11 VITALS — BP 145/92 | HR 99 | Resp 17 | Wt 238.0 lb

## 2017-06-11 DIAGNOSIS — I83813 Varicose veins of bilateral lower extremities with pain: Secondary | ICD-10-CM

## 2017-06-11 DIAGNOSIS — I872 Venous insufficiency (chronic) (peripheral): Secondary | ICD-10-CM | POA: Diagnosis not present

## 2017-06-11 NOTE — Progress Notes (Signed)
Subjective:    Patient ID: Michael Pruitt, male    DOB: 1965/09/03, 52 y.o.   MRN: [id number] Chief Complaint  Patient presents with  . Follow-up    3 month follow up   Patient presents for a 77-month follow-up.  The patient was originally seen for painful varicose veins located to the right lower extremity.  The patient was found to reflux located in the deep system as well as the right great saphenous vein.  Since our initial visit, the patient has been engaging in conservative therapy including wearing medical grade 1 compression stockings elevating his legs and remaining active on a daily basis with minimal improvement in symptoms.  The patient has been taking over-the-counter anti-inflammatories with minimal relief.  The patient's symptoms have progressed to the point he is unable to function on a daily basis.  The discomfort he experiences along the varicosities located in his right lower extremity have become lifestyle limiting.  The patient presents today in regard to moving forward with laser ablation to the right great saphenous vein.  The patient denies any fever, nausea vomiting.   Review of Systems  Constitutional: Negative.   HENT: Negative.   Eyes: Negative.   Respiratory: Negative.   Cardiovascular:       Painful varicosities to the right lower extremity Right greater saphenous vein reflux  Gastrointestinal: Negative.   Endocrine: Negative.   Genitourinary: Negative.   Musculoskeletal: Negative.   Skin: Negative.   Allergic/Immunologic: Negative.   Neurological: Negative.   Hematological: Negative.   Psychiatric/Behavioral: Negative.       Objective:   Physical Exam  Constitutional: He is oriented to person, place, and time. He appears well-developed and well-nourished. No distress.  HENT:  Head: Normocephalic and atraumatic.  Eyes: Conjunctivae are normal. Pupils are equal, round, and reactive to light.  Neck: Normal range of motion.  Cardiovascular: Normal  rate, regular rhythm, normal heart sounds and intact distal pulses.  Pulses:      Radial pulses are 2+ on the right side, and 2+ on the left side.       Dorsalis pedis pulses are 2+ on the right side, and 2+ on the left side.       Posterior tibial pulses are 2+ on the right side, and 2+ on the left side.  Pulmonary/Chest: Effort normal and breath sounds normal.  Musculoskeletal: Normal range of motion. He exhibits edema (Mild nonpitting right lower extremity edema noted).  Neurological: He is alert and oriented to person, place, and time.  Skin: He is not diaphoretic.  Right lower extremity: >1cm varicosities to the right lower extremity. Left lower extremity: less than 1 cm varicosities Mild stasis dermatitis bilaterally. There are no skin changes. Skin is intact.   Psychiatric: He has a normal mood and affect. His behavior is normal. Judgment and thought content normal.  Vitals reviewed.  BP (!) 145/92 (BP Location: Right Arm)   Pulse 99   Resp 17   Wt 238 lb (108 kg)   BMI 33.19 kg/m   Past Medical History:  Diagnosis Date  . Gout    Social History   Socioeconomic History  . Marital status: Single    Spouse name: Not on file  . Number of children: Not on file  . Years of education: Not on file  . Highest education level: Not on file  Social Needs  . Financial resource strain: Not on file  . Food insecurity - worry: Not on file  .  Food insecurity - inability: Not on file  . Transportation needs - medical: Not on file  . Transportation needs - non-medical: Not on file  Occupational History  . Not on file  Tobacco Use  . Smoking status: Current Every Day Smoker    Types: Cigarettes  . Smokeless tobacco: Never Used  Substance and Sexual Activity  . Alcohol use: Yes  . Drug use: No  . Sexual activity: Not on file  Other Topics Concern  . Not on file  Social History Narrative  . Not on file   Past Surgical History:  Procedure Laterality Date  . TONSILLECTOMY      . TOOTH EXTRACTION     Family History  Problem Relation Age of Onset  . Cancer Mother   . Asthma Father    Allergies  Allergen Reactions  . Aleve [Naproxen Sodium] Itching       Assessment & Plan:  Patient presents for a 41-month follow-up.  The patient was originally seen for painful varicose veins located to the right lower extremity.  The patient was found to reflux located in the deep system as well as the right great saphenous vein.  Since our initial visit, the patient has been engaging in conservative therapy including wearing medical grade 1 compression stockings elevating his legs and remaining active on a daily basis with minimal improvement in symptoms.  The patient has been taking over-the-counter anti-inflammatories with minimal relief.  The patient's symptoms have progressed to the point he is unable to function on a daily basis.  The discomfort he experiences along the varicosities located in his right lower extremity have become lifestyle limiting.  The patient presents today in regard to moving forward with laser ablation to the right great saphenous vein.  The patient denies any fever, nausea vomiting.  1. Chronic venous insufficiency - Stable The patient is likely to benefit from endovenous laser ablation. I have discussed the risks and benefits of the procedure. The risks primarily include DVT, recanalization, bleeding, infection, and inability to gain access. The patient was encouraged to continue engaging in conservative therapy until I have approval from his insurance company The patient expresses his understanding The patient was instructed to call the office in the interim if any worsening edema or ulcerations to the legs, feet or toes occurs. The patient expresses their understanding.  2. Varicose veins of both lower extremities with pain - Stable Once the patient undergoes laser ablation to the right greater saphenous vein if he does not see any improvement in the  varicosities noted to the right lower extremity he may benefit from foam and saline sclerotherapy  Current Outpatient Medications on File Prior to Visit  Medication Sig Dispense Refill  . acetaminophen (TYLENOL) 500 MG tablet Take 500 mg by mouth every 6 (six) hours as needed.    . doxycycline (VIBRAMYCIN) 100 MG capsule Take 1 capsule (100 mg total) by mouth 2 (two) times daily. (Patient not taking: Reported on 03/10/2017) 14 capsule 0  . gabapentin (NEURONTIN) 300 MG capsule TAKE 1 CAPSULE BY MOUTH THREE TIMES A DAY  1  . HYDROcodone-acetaminophen (NORCO) 7.5-325 MG per tablet Take 1 tablet by mouth every 4 (four) hours as needed for pain. (Patient not taking: Reported on 03/10/2017) 20 tablet 0  . naproxen sodium (ALEVE) 220 MG tablet Take 440 mg by mouth once as needed (for headache pain).    Marland Kitchen penicillin v potassium (VEETID) 500 MG tablet 2 PO BID WITH FOOD (Patient not taking: Reported  on 03/10/2017) 28 tablet 0  . traMADol (ULTRAM) 50 MG tablet TAKE 1 TABLET BY MOUTH 3 TIMES A DAY X 7 DAYS  0   No current facility-administered medications on file prior to visit.    There are no Patient Instructions on file for this visit. No Follow-up on file.  Denese Mentink A Braylin Xu, PA-C

## 2017-07-17 ENCOUNTER — Encounter (INDEPENDENT_AMBULATORY_CARE_PROVIDER_SITE_OTHER): Payer: Self-pay | Admitting: Vascular Surgery

## 2017-07-17 ENCOUNTER — Other Ambulatory Visit (INDEPENDENT_AMBULATORY_CARE_PROVIDER_SITE_OTHER): Payer: Self-pay | Admitting: Vascular Surgery

## 2017-07-17 ENCOUNTER — Ambulatory Visit (INDEPENDENT_AMBULATORY_CARE_PROVIDER_SITE_OTHER): Payer: BLUE CROSS/BLUE SHIELD | Admitting: Vascular Surgery

## 2017-07-17 VITALS — BP 138/88 | HR 90 | Resp 17 | Wt 240.0 lb

## 2017-07-17 DIAGNOSIS — I83813 Varicose veins of bilateral lower extremities with pain: Secondary | ICD-10-CM | POA: Diagnosis not present

## 2017-07-17 DIAGNOSIS — I83811 Varicose veins of right lower extremities with pain: Secondary | ICD-10-CM

## 2017-07-17 NOTE — Progress Notes (Signed)
    MRN : [id number]  Michael Pruitt is a 52 y.o. (1966/05/06) male who presents with chief complaint of  Chief Complaint  Patient presents with  . Follow-up    Right GSV laser ablation  .    The patient's right lower extremity was sterilely prepped and draped.  The ultrasound machine was used to visualize the right great saphenous vein throughout its course.  A segment below the knee was selected for access.  The saphenous vein was accessed without difficulty using ultrasound guidance with a micropuncture needle.   An 0.018  wire was placed beyond the saphenofemoral junction through the sheath and the microneedle was removed.  The 65 cm sheath was then placed over the wire and the wire and dilator were removed.  The laser fiber was placed through the sheath and its tip was placed approximately 2 cm below the saphenofemoral junction.  Tumescent anesthesia was then created with a dilute lidocaine solution.  Laser energy was then delivered with constant withdrawal of the sheath and laser fiber.  Approximately 1650 Joules of energy were delivered over a length of 45 cm.  Sterile dressings were placed.  The patient tolerated the procedure well without complications.

## 2017-07-21 ENCOUNTER — Ambulatory Visit (INDEPENDENT_AMBULATORY_CARE_PROVIDER_SITE_OTHER): Payer: BLUE CROSS/BLUE SHIELD

## 2017-07-21 DIAGNOSIS — I83811 Varicose veins of right lower extremities with pain: Secondary | ICD-10-CM | POA: Diagnosis not present

## 2017-08-11 ENCOUNTER — Encounter (INDEPENDENT_AMBULATORY_CARE_PROVIDER_SITE_OTHER): Payer: Self-pay | Admitting: Vascular Surgery

## 2017-08-11 ENCOUNTER — Ambulatory Visit (INDEPENDENT_AMBULATORY_CARE_PROVIDER_SITE_OTHER): Payer: BLUE CROSS/BLUE SHIELD | Admitting: Vascular Surgery

## 2017-08-11 VITALS — BP 143/88 | HR 90 | Resp 16 | Wt 236.8 lb

## 2017-08-11 DIAGNOSIS — I872 Venous insufficiency (chronic) (peripheral): Secondary | ICD-10-CM | POA: Diagnosis not present

## 2017-08-11 DIAGNOSIS — I83813 Varicose veins of bilateral lower extremities with pain: Secondary | ICD-10-CM | POA: Diagnosis not present

## 2017-08-11 NOTE — Progress Notes (Signed)
MRN : [id number]  Michael SaxonRandy T Gonsalez is a 52 y.o. (December 14, 1965) male who presents with chief complaint of No chief complaint on file. Marland Kitchen.  History of Present Illness: The patient returns to the office for followup status post laser ablation of the right great saphenous vein on 07/17/2017.  The patient note significant improvement in the lower extremity pain but not resolution of the symptoms. The patient notes multiple residual varicosities bilaterally which continued to hurt with dependent positions and remained tender to palpation. The patient's swelling is minimally from preoperative status. The patient continues to wear graduated compression stockings on a daily basis but these are not eliminating the pain and discomfort. The patient continues to use over-the-counter anti-inflammatory medications to treat the pain and related symptoms but this has not given the patient relief. The patient notes the pain in the lower extremities is causing problems with daily exercise, problems at work and even with household activities such as preparing meals and doing dishes.  The patient is otherwise done well and there have been no complications related to the laser procedure or interval changes in the patient's overall   Post laser ultrasound shows successful ablation of the right great saphenous vein   No outpatient medications have been marked as taking for the 08/11/17 encounter (Appointment) with Gilda CreaseSchnier, Latina CraverGregory G, MD.    Past Medical History:  Diagnosis Date  . Gout     Past Surgical History:  Procedure Laterality Date  . TONSILLECTOMY    . TOOTH EXTRACTION      Social History Social History   Tobacco Use  . Smoking status: Current Every Day Smoker    Types: Cigarettes  . Smokeless tobacco: Never Used  Substance Use Topics  . Alcohol use: Yes  . Drug use: No    Family History Family History  Problem Relation Age of Onset  . Cancer Mother   . Asthma Father     Allergies    Allergen Reactions  . Aleve [Naproxen Sodium] Itching     REVIEW OF SYSTEMS (Negative unless checked)  Constitutional: [] Weight loss  [] Fever  [] Chills Cardiac: [] Chest pain   [] Chest pressure   [] Palpitations   [] Shortness of breath when laying flat   [] Shortness of breath with exertion. Vascular:  [] Pain in legs with walking   [x] Pain in legs at rest  [] History of DVT   [] Phlebitis   [x] Swelling in legs   [x] Varicose veins   [] Non-healing ulcers Pulmonary:   [] Uses home oxygen   [] Productive cough   [] Hemoptysis   [] Wheeze  [] COPD   [] Asthma Neurologic:  [] Dizziness   [] Seizures   [] History of stroke   [] History of TIA  [] Aphasia   [] Vissual changes   [] Weakness or numbness in arm   [] Weakness or numbness in leg Musculoskeletal:   [] Joint swelling   [] Joint pain   [] Low back pain Hematologic:  [] Easy bruising  [] Easy bleeding   [] Hypercoagulable state   [] Anemic Gastrointestinal:  [] Diarrhea   [] Vomiting  [] Gastroesophageal reflux/heartburn   [] Difficulty swallowing. Genitourinary:  [] Chronic kidney disease   [] Difficult urination  [] Frequent urination   [] Blood in urine Skin:  [] Rashes   [] Ulcers  Psychological:  [] History of anxiety   []  History of major depression.  Physical Examination  There were no vitals filed for this visit. There is no height or weight on file to calculate BMI. Gen: WD/WN, NAD Head: [place]/AT, No temporalis wasting.  Ear/Nose/Throat: Hearing grossly intact, nares w/o erythema or drainage Eyes: PER, EOMI,  sclera nonicteric.  Neck: Supple, no large masses.   Pulmonary:  Good air movement, no audible wheezing bilaterally, no use of accessory muscles.  Cardiac: RRR, no JVD Vascular: Large varicosities present extensively greater than 10 mm of the right leg.  Mild venous stasis changes to the legs bilaterally.  2+ soft pitting edema Vessel Right Left  Radial Palpable Palpable  PT Palpable Palpable  DP Palpable Palpable  Gastrointestinal: Non-distended. No  guarding/no peritoneal signs.  Musculoskeletal: M/S 5/5 throughout.  No deformity or atrophy.  Neurologic: CN 2-12 intact. Symmetrical.  Speech is fluent. Motor exam as listed above. Psychiatric: Judgment intact, Mood & affect appropriate for pt's clinical situation. Dermatologic: venous rashes no ulcers noted.  No changes consistent with cellulitis. Lymph : No lichenification or skin changes of chronic lymphedema.  CBC Lab Results  Component Value Date   WBC 10.1 03/06/2017   HGB 15.7 03/06/2017   HCT 45.0 03/06/2017   MCV 90.5 03/06/2017   PLT 229 03/06/2017    BMET    Component Value Date/Time   NA 135 03/06/2017 1539   K 4.2 03/06/2017 1539   CL 105 03/06/2017 1539   CO2 22 03/06/2017 1539   GLUCOSE 100 (H) 03/06/2017 1539   BUN 13 03/06/2017 1539   CREATININE 0.89 03/06/2017 1539   CALCIUM 8.6 (L) 03/06/2017 1539   GFRNONAA >60 03/06/2017 1539   GFRAA >60 03/06/2017 1539   CrCl cannot be calculated (Patient's most recent lab result is older than the maximum 21 days allowed.).  COAG Lab Results  Component Value Date   INR 0.90 03/06/2017    Radiology No results found.  Assessment/Plan 1. Varicose veins of both lower extremities with pain Recommend:  The patient has had successful ablation of the previously incompetent saphenous venous system but still has persistent symptoms of pain and swelling that are having a negative impact on daily life and daily activities.  Patient should undergo injection sclerotherapy to treat the residual varicosities.  The risks, benefits and alternative therapies were reviewed in detail with the patient.  All questions were answered.  The patient agrees to proceed with sclerotherapy at their convenience.  The patient will continue wearing the graduated compression stockings and using the over-the-counter pain medications to treat her symptoms.       2. Chronic venous insufficiency Recommend:  The patient has had successful  ablation of the previously incompetent saphenous venous system but still has persistent symptoms of pain and swelling that are having a negative impact on daily life and daily activities.  Patient should undergo injection sclerotherapy to treat the residual varicosities.  The risks, benefits and alternative therapies were reviewed in detail with the patient.  All questions were answered.  The patient agrees to proceed with sclerotherapy at their convenience.  The patient will continue wearing the graduated compression stockings and using the over-the-counter pain medications to treat her symptoms.         Levora Dredge, MD  08/11/2017 9:08 AM

## 2017-08-18 ENCOUNTER — Encounter (INDEPENDENT_AMBULATORY_CARE_PROVIDER_SITE_OTHER): Payer: BLUE CROSS/BLUE SHIELD

## 2017-09-15 ENCOUNTER — Ambulatory Visit (INDEPENDENT_AMBULATORY_CARE_PROVIDER_SITE_OTHER): Payer: BLUE CROSS/BLUE SHIELD | Admitting: Vascular Surgery

## 2017-09-15 ENCOUNTER — Encounter (INDEPENDENT_AMBULATORY_CARE_PROVIDER_SITE_OTHER): Payer: Self-pay | Admitting: Vascular Surgery

## 2017-09-15 VITALS — BP 151/94 | HR 81 | Resp 17 | Ht 71.0 in | Wt 232.0 lb

## 2017-09-15 DIAGNOSIS — I83813 Varicose veins of bilateral lower extremities with pain: Secondary | ICD-10-CM

## 2017-09-15 NOTE — Progress Notes (Signed)
Varicose veins of right  lower extremity with inflammation (454.1  I83.10) Current Plans   Indication: Patient presents with symptomatic varicose veins of the right  lower extremity.   Procedure: Sclerotherapy using hypertonic saline mixed with 1% Lidocaine was performed on the right lower extremity. Compression wraps were placed. The patient tolerated the procedure well. 

## 2017-10-14 ENCOUNTER — Ambulatory Visit (INDEPENDENT_AMBULATORY_CARE_PROVIDER_SITE_OTHER): Payer: BLUE CROSS/BLUE SHIELD | Admitting: Vascular Surgery

## 2017-10-14 ENCOUNTER — Encounter (INDEPENDENT_AMBULATORY_CARE_PROVIDER_SITE_OTHER): Payer: Self-pay | Admitting: Vascular Surgery

## 2017-10-14 VITALS — BP 135/88 | HR 91 | Resp 16 | Ht 71.0 in | Wt 225.0 lb

## 2017-10-14 DIAGNOSIS — I83811 Varicose veins of right lower extremities with pain: Secondary | ICD-10-CM | POA: Diagnosis not present

## 2017-10-14 DIAGNOSIS — I83813 Varicose veins of bilateral lower extremities with pain: Secondary | ICD-10-CM

## 2017-10-14 NOTE — Progress Notes (Signed)
Varicose veins of right  lower extremity with inflammation (454.1  I83.10) Current Plans   Indication: Patient presents with symptomatic varicose veins of the right  lower extremity.   Procedure: Sclerotherapy using hypertonic saline mixed with 1% Lidocaine was performed on the right lower extremity. Compression wraps were placed. The patient tolerated the procedure well. 

## 2017-11-18 ENCOUNTER — Encounter (INDEPENDENT_AMBULATORY_CARE_PROVIDER_SITE_OTHER): Payer: Self-pay | Admitting: Vascular Surgery

## 2017-11-18 ENCOUNTER — Ambulatory Visit (INDEPENDENT_AMBULATORY_CARE_PROVIDER_SITE_OTHER): Payer: BLUE CROSS/BLUE SHIELD | Admitting: Vascular Surgery

## 2017-11-18 VITALS — BP 143/93 | HR 74 | Resp 16 | Ht 71.0 in | Wt 222.0 lb

## 2017-11-18 DIAGNOSIS — I83813 Varicose veins of bilateral lower extremities with pain: Secondary | ICD-10-CM

## 2017-11-18 DIAGNOSIS — I83811 Varicose veins of right lower extremities with pain: Secondary | ICD-10-CM

## 2017-11-18 NOTE — Progress Notes (Signed)
Varicose veins of right  lower extremity with inflammation (454.1  I83.10) Current Plans   Indication: Patient presents with symptomatic varicose veins of the right  lower extremity.   Procedure: Sclerotherapy using hypertonic saline mixed with 1% Lidocaine was performed on the right lower extremity. Compression wraps were placed. The patient tolerated the procedure well. 

## 2018-05-04 DIAGNOSIS — D225 Melanocytic nevi of trunk: Secondary | ICD-10-CM | POA: Diagnosis not present

## 2018-05-04 DIAGNOSIS — D2261 Melanocytic nevi of right upper limb, including shoulder: Secondary | ICD-10-CM | POA: Diagnosis not present

## 2018-05-04 DIAGNOSIS — D2272 Melanocytic nevi of left lower limb, including hip: Secondary | ICD-10-CM | POA: Diagnosis not present

## 2018-05-04 DIAGNOSIS — L570 Actinic keratosis: Secondary | ICD-10-CM | POA: Diagnosis not present

## 2018-05-04 DIAGNOSIS — D2262 Melanocytic nevi of left upper limb, including shoulder: Secondary | ICD-10-CM | POA: Diagnosis not present

## 2018-05-04 DIAGNOSIS — X32XXXA Exposure to sunlight, initial encounter: Secondary | ICD-10-CM | POA: Diagnosis not present

## 2018-10-15 DIAGNOSIS — E6609 Other obesity due to excess calories: Secondary | ICD-10-CM | POA: Diagnosis not present

## 2018-10-15 DIAGNOSIS — M1A00X0 Idiopathic chronic gout, unspecified site, without tophus (tophi): Secondary | ICD-10-CM | POA: Diagnosis not present

## 2018-10-15 DIAGNOSIS — Z1389 Encounter for screening for other disorder: Secondary | ICD-10-CM | POA: Diagnosis not present

## 2018-10-15 DIAGNOSIS — Z0000 Encounter for general adult medical examination without abnormal findings: Secondary | ICD-10-CM | POA: Diagnosis not present

## 2018-10-15 DIAGNOSIS — Z6835 Body mass index (BMI) 35.0-35.9, adult: Secondary | ICD-10-CM | POA: Diagnosis not present

## 2019-03-08 DIAGNOSIS — K921 Melena: Secondary | ICD-10-CM | POA: Diagnosis not present

## 2019-03-08 DIAGNOSIS — R5383 Other fatigue: Secondary | ICD-10-CM | POA: Diagnosis not present

## 2019-03-08 DIAGNOSIS — E6609 Other obesity due to excess calories: Secondary | ICD-10-CM | POA: Diagnosis not present

## 2019-03-08 DIAGNOSIS — Z6834 Body mass index (BMI) 34.0-34.9, adult: Secondary | ICD-10-CM | POA: Diagnosis not present

## 2019-03-11 ENCOUNTER — Encounter (INDEPENDENT_AMBULATORY_CARE_PROVIDER_SITE_OTHER): Payer: Self-pay | Admitting: Nurse Practitioner

## 2019-03-15 ENCOUNTER — Encounter (INDEPENDENT_AMBULATORY_CARE_PROVIDER_SITE_OTHER): Payer: Self-pay | Admitting: *Deleted

## 2019-03-22 DIAGNOSIS — H524 Presbyopia: Secondary | ICD-10-CM | POA: Diagnosis not present

## 2019-05-03 DIAGNOSIS — X32XXXA Exposure to sunlight, initial encounter: Secondary | ICD-10-CM | POA: Diagnosis not present

## 2019-05-03 DIAGNOSIS — D2262 Melanocytic nevi of left upper limb, including shoulder: Secondary | ICD-10-CM | POA: Diagnosis not present

## 2019-05-03 DIAGNOSIS — D2271 Melanocytic nevi of right lower limb, including hip: Secondary | ICD-10-CM | POA: Diagnosis not present

## 2019-05-03 DIAGNOSIS — L309 Dermatitis, unspecified: Secondary | ICD-10-CM | POA: Diagnosis not present

## 2019-05-03 DIAGNOSIS — L986 Other infiltrative disorders of the skin and subcutaneous tissue: Secondary | ICD-10-CM | POA: Diagnosis not present

## 2019-05-03 DIAGNOSIS — D2261 Melanocytic nevi of right upper limb, including shoulder: Secondary | ICD-10-CM | POA: Diagnosis not present

## 2019-05-03 DIAGNOSIS — D225 Melanocytic nevi of trunk: Secondary | ICD-10-CM | POA: Diagnosis not present

## 2019-05-03 DIAGNOSIS — L570 Actinic keratosis: Secondary | ICD-10-CM | POA: Diagnosis not present

## 2019-05-03 DIAGNOSIS — C44629 Squamous cell carcinoma of skin of left upper limb, including shoulder: Secondary | ICD-10-CM | POA: Diagnosis not present

## 2019-05-03 DIAGNOSIS — D485 Neoplasm of uncertain behavior of skin: Secondary | ICD-10-CM | POA: Diagnosis not present

## 2019-05-13 ENCOUNTER — Ambulatory Visit (INDEPENDENT_AMBULATORY_CARE_PROVIDER_SITE_OTHER): Payer: BLUE CROSS/BLUE SHIELD | Admitting: Nurse Practitioner

## 2019-05-27 ENCOUNTER — Ambulatory Visit (INDEPENDENT_AMBULATORY_CARE_PROVIDER_SITE_OTHER): Payer: BLUE CROSS/BLUE SHIELD | Admitting: Nurse Practitioner

## 2019-07-14 DIAGNOSIS — C44629 Squamous cell carcinoma of skin of left upper limb, including shoulder: Secondary | ICD-10-CM | POA: Diagnosis not present

## 2019-10-26 DIAGNOSIS — Z85828 Personal history of other malignant neoplasm of skin: Secondary | ICD-10-CM | POA: Diagnosis not present

## 2019-10-26 DIAGNOSIS — D225 Melanocytic nevi of trunk: Secondary | ICD-10-CM | POA: Diagnosis not present

## 2019-10-26 DIAGNOSIS — L309 Dermatitis, unspecified: Secondary | ICD-10-CM | POA: Diagnosis not present

## 2019-10-26 DIAGNOSIS — D2262 Melanocytic nevi of left upper limb, including shoulder: Secondary | ICD-10-CM | POA: Diagnosis not present

## 2020-06-22 DIAGNOSIS — H524 Presbyopia: Secondary | ICD-10-CM | POA: Diagnosis not present

## 2020-08-07 DIAGNOSIS — Z0001 Encounter for general adult medical examination with abnormal findings: Secondary | ICD-10-CM | POA: Diagnosis not present

## 2020-08-07 DIAGNOSIS — S76012A Strain of muscle, fascia and tendon of left hip, initial encounter: Secondary | ICD-10-CM | POA: Diagnosis not present

## 2020-08-07 DIAGNOSIS — L989 Disorder of the skin and subcutaneous tissue, unspecified: Secondary | ICD-10-CM | POA: Diagnosis not present

## 2020-08-07 DIAGNOSIS — Z6834 Body mass index (BMI) 34.0-34.9, adult: Secondary | ICD-10-CM | POA: Diagnosis not present

## 2020-08-07 DIAGNOSIS — Z1331 Encounter for screening for depression: Secondary | ICD-10-CM | POA: Diagnosis not present

## 2020-08-24 ENCOUNTER — Inpatient Hospital Stay (HOSPITAL_COMMUNITY): Payer: Self-pay | Admitting: Hematology

## 2020-08-24 NOTE — Progress Notes (Incomplete)
[facility] [address], [place] [address]   CLINIC:  Medical Oncology/Hematology  CONSULT NOTE  Patient Care Team: Karleen Hampshire, MD as PCP - General (Family Medicine)  CHIEF COMPLAINTS/PURPOSE OF CONSULTATION:  Leukocytosis  HISTORY OF PRESENTING ILLNESS:  Michael Pruitt 55 y.o. male is here because of leukocytosis, at the request of his primary care provider at [facility].  He was referred by Lenise Herald, PA-C/Dr. Assunta Found, MD.  Labs sent by PCP show that the patient has had mildly elevated WBC for at least the past 2 years with WBC 11.0 with normal differential (08/07/2020), and WBC 12.0 with slight neutrophilia 7.5 (10/15/2018).  *** He is not on any medications, no steroids.  No recent infections.  No history of connective tissue disorder or chronic inflammatory disease. *** He denies any B symptoms. *** Denies any nausea, vomiting, or diarrhea. Denies any new pains. *** No new neurologic symptoms such as new-onset hearing loss, blurred vision, headache, or dizziness.  Denies numbness or tingling in hands and feet. *** No thromboembolic events since his last visit. *** Had not noticed any recent bleeding such as epistaxis, hematuria or hematochezia. *** Denies recent chest pain on exertion, shortness of breath on minimal exertion, pre-syncopal episodes, or palpitations. *** Denies any recent fevers, infections, or recent hospitalizations. No new masses or lymphadenopathy per his report. *** Patient reports appetite at *** and energy level at ***. He  is maintaining stable weight at this time.  *** SOCIAL HISTORY: Employed full-time, works as a Visual merchandiser Patient is a current everyday smoker, smoking 2 PPD.  No alcohol or illicit drug use.  *** Family history   MEDICAL HISTORY:  Past Medical History:  Diagnosis Date  . Gout     SURGICAL HISTORY: Past Surgical History:  Procedure Laterality Date  . TONSILLECTOMY    .  TOOTH EXTRACTION      SOCIAL HISTORY: Social History   Socioeconomic History  . Marital status: Single    Spouse name: Not on file  . Number of children: Not on file  . Years of education: Not on file  . Highest education level: Not on file  Occupational History  . Not on file  Tobacco Use  . Smoking status: Current Every Day Smoker    Types: Cigarettes  . Smokeless tobacco: Never Used  Substance and Sexual Activity  . Alcohol use: Yes  . Drug use: No  . Sexual activity: Not on file  Other Topics Concern  . Not on file  Social History Narrative  . Not on file   Social Determinants of Health   Financial Resource Strain: Not on file  Food Insecurity: Not on file  Transportation Needs: Not on file  Physical Activity: Not on file  Stress: Not on file  Social Connections: Not on file  Intimate Partner Violence: Not on file    FAMILY HISTORY: Family History  Problem Relation Age of Onset  . Cancer Mother   . Asthma Father     ALLERGIES:  is allergic to aleve [naproxen sodium].  MEDICATIONS:  Current Outpatient Medications  Medication Sig Dispense Refill  . acetaminophen (TYLENOL) 500 MG tablet Take 500 mg by mouth every 6 (six) hours as needed.    . ALPRAZolam (XANAX) 0.5 MG tablet   0  . doxycycline (VIBRAMYCIN) 100 MG capsule Take 1 capsule (100 mg total) by mouth 2 (two) times daily. (Patient not taking: Reported on 03/10/2017) 14 capsule 0  .  gabapentin (NEURONTIN) 300 MG capsule TAKE 1 CAPSULE BY MOUTH THREE TIMES A DAY  1  . HYDROcodone-acetaminophen (NORCO) 7.5-325 MG per tablet Take 1 tablet by mouth every 4 (four) hours as needed for pain. (Patient not taking: Reported on 03/10/2017) 20 tablet 0  . naproxen sodium (ALEVE) 220 MG tablet Take 440 mg by mouth once as needed (for headache pain).    Marland Kitchen penicillin v potassium (VEETID) 500 MG tablet 2 PO BID WITH FOOD (Patient not taking: Reported on 03/10/2017) 28 tablet 0  . traMADol (ULTRAM) 50 MG tablet TAKE 1  TABLET BY MOUTH 3 TIMES A DAY X 7 DAYS  0   No current facility-administered medications for this visit.    REVIEW OF SYSTEMS:   Review of Systems - Oncology    PHYSICAL EXAMINATION: ECOG PERFORMANCE STATUS: {CHL ONC ECOG PS:[id number]}  There were no vitals filed for this visit. There were no vitals filed for this visit.  Physical Exam    LABORATORY DATA:  I have reviewed the data as listed No results found for this or any previous visit (from the past 2160 hour(s)).  RADIOGRAPHIC STUDIES: I have personally reviewed the radiological images as listed and agreed with the findings in the report. No results found.  ASSESSMENT: 1.  Leukocytosis of undetermined cause -Labs sent by PCP show that the patient has had mildly elevated WBC for at least the past 2 years with WBC 11.0 with normal differential (08/07/2020), and WBC 12.0 with slight neutrophilia 7.5 (10/15/2018). -*** -Consider possible reactive leukocytosis due to high level of tobacco use (smokes 2 PPD cigarettes)  PLAN:  1.  Leukocytosis of undetermined cause -Check CBC, BCR-ABL FISH, and JAK2 -Follow-up appointment in 2 weeks to discuss results  PLAN SUMMARY & DISPOSITION: ***  All questions were answered. The patient knows to call the clinic with any problems, questions or concerns.   Medical decision making: ***  Time spent on visit: I spent {CHL ONC TIME VISIT - CHYIF:[id number]} counseling the patient face to face. The total time spent in the appointment was {CHL ONC TIME VISIT - MVEHM:[id number]} and more than 50% was on counseling.     Carnella Guadalajara, PA-C 08/24/20 8:45 AM

## 2020-09-06 ENCOUNTER — Inpatient Hospital Stay (HOSPITAL_COMMUNITY): Payer: BC Managed Care – PPO | Attending: Hematology | Admitting: Hematology

## 2020-09-06 ENCOUNTER — Inpatient Hospital Stay (HOSPITAL_COMMUNITY): Payer: BC Managed Care – PPO

## 2020-09-06 ENCOUNTER — Encounter (HOSPITAL_COMMUNITY): Payer: Self-pay | Admitting: Hematology

## 2020-09-06 ENCOUNTER — Other Ambulatory Visit: Payer: Self-pay

## 2020-09-06 VITALS — BP 171/95 | HR 84 | Temp 97.2°F | Resp 18

## 2020-09-06 DIAGNOSIS — Z801 Family history of malignant neoplasm of trachea, bronchus and lung: Secondary | ICD-10-CM | POA: Diagnosis not present

## 2020-09-06 DIAGNOSIS — F17210 Nicotine dependence, cigarettes, uncomplicated: Secondary | ICD-10-CM | POA: Diagnosis not present

## 2020-09-06 DIAGNOSIS — D72829 Elevated white blood cell count, unspecified: Secondary | ICD-10-CM | POA: Insufficient documentation

## 2020-09-06 DIAGNOSIS — R030 Elevated blood-pressure reading, without diagnosis of hypertension: Secondary | ICD-10-CM | POA: Diagnosis not present

## 2020-09-06 LAB — CBC WITH DIFFERENTIAL/PLATELET
Abs Immature Granulocytes: 0.04 10*3/uL (ref 0.00–0.07)
Basophils Absolute: 0.1 10*3/uL (ref 0.0–0.1)
Basophils Relative: 1 %
Eosinophils Absolute: 0.3 10*3/uL (ref 0.0–0.5)
Eosinophils Relative: 4 %
HCT: 50.3 % (ref 39.0–52.0)
Hemoglobin: 16.6 g/dL (ref 13.0–17.0)
Immature Granulocytes: 1 %
Lymphocytes Relative: 27 %
Lymphs Abs: 2.1 10*3/uL (ref 0.7–4.0)
MCH: 30.9 pg (ref 26.0–34.0)
MCHC: 33 g/dL (ref 30.0–36.0)
MCV: 93.7 fL (ref 80.0–100.0)
Monocytes Absolute: 0.8 10*3/uL (ref 0.1–1.0)
Monocytes Relative: 10 %
Neutro Abs: 4.5 10*3/uL (ref 1.7–7.7)
Neutrophils Relative %: 57 %
Platelets: 233 10*3/uL (ref 150–400)
RBC: 5.37 MIL/uL (ref 4.22–5.81)
RDW: 13.5 % (ref 11.5–15.5)
WBC: 7.8 10*3/uL (ref 4.0–10.5)
nRBC: 0 % (ref 0.0–0.2)

## 2020-09-06 LAB — LACTATE DEHYDROGENASE: LDH: 127 U/L (ref 98–192)

## 2020-09-06 LAB — SEDIMENTATION RATE: Sed Rate: 15 mm/hr (ref 0–16)

## 2020-09-06 LAB — C-REACTIVE PROTEIN: CRP: 1.5 mg/dL — ABNORMAL HIGH (ref <1.0)

## 2020-09-06 NOTE — Patient Instructions (Signed)
[facility] at [facility] Discharge Instructions  You were seen today by Dr. Ellin Saba and Rojelio Brenner PA-C for your elevated white blood cell count ("leuocytosis"). This is most likely reactive due to your smoking and tobacco use.  We will check labs to make sure no other abnormalities are present.    LABS: Labs today before leaving   OTHER TESTS: None  MEDICATIONS: No changes  FOLLOW-UP APPOINTMENT: Office visit in 2-3 weeks to discuss results   Thank you for choosing [facility] at [facility] to provide your oncology and hematology care.  To afford each patient quality time with our provider, please arrive at least 15 minutes before your scheduled appointment time.   If you have a lab appointment with the Cancer Center please come in thru the Main Entrance and check in at the main information desk.  You need to re-schedule your appointment should you arrive 10 or more minutes late.  We strive to give you quality time with our providers, and arriving late affects you and other patients whose appointments are after yours.  Also, if you no show three or more times for appointments you may be dismissed from the clinic at the providers discretion.     Again, thank you for choosing [facility].  Our hope is that these requests will decrease the amount of time that you wait before being seen by our physicians.       _____________________________________________________________  Should you have questions after your visit to [facility], please contact our office at [phone] and follow the prompts.  Our office hours are 8:00 a.m. and 4:30 p.m. Monday - Friday.  Please note that voicemails left after 4:00 p.m. may not be returned until the following business day.  We are closed weekends and major holidays.  You do have access to a nurse 24-7, just call the main number to the clinic [phone] and do not press any  options, hold on the line and a nurse will answer the phone.    For prescription refill requests, have your pharmacy contact our office and allow 72 hours.    Due to Covid, you will need to wear a mask upon entering the hospital. If you do not have a mask, a mask will be given to you at the Main Entrance upon arrival. For doctor visits, patients may have 1 support person age 78 or older with them. For treatment visits, patients can not have anyone with them due to social distancing guidelines and our immunocompromised population.

## 2020-09-06 NOTE — Progress Notes (Signed)
[place] [place], [place] [address]   CLINIC:  Medical Oncology/Hematology  CONSULT NOTE  Patient Care Team: Elsie Lincoln, MD as PCP - General (Family Medicine)  CHIEF COMPLAINTS/PURPOSE OF CONSULTATION:  Leukocytosis  HISTORY OF PRESENTING ILLNESS:  Michael Pruitt 55 y.o. male is here because of leukocytosis, at the request of his primary care provider at [facility]  He was referred by Collene Mares, PA-C and Dr. Sharilyn Sites, MD.  Labs sent by PCP show that the patient has had mildly elevated WBC for at least the past 2 years with WBC 11.0 with normal differential (08/07/2020), and WBC 12.0 with slight neutrophilia 7.5 (10/15/2018).  Patient reports that he is doing well overall.  His only complaint today is sinus congestion secondary to allergies.  He is not on any medications, no oral steroids.  He did use steroid cream for skin irritation in January 2022, but this does not correlate with the timeline of his abnormal labs. No recent infections.  No history of connective tissue disorder or chronic inflammatory disease. He denies any B symptoms. Denies any nausea, vomiting, or diarrhea. Denies any new pains. No new neurologic symptoms such as new-onset hearing loss, blurred vision, headache, or dizziness.  Denies numbness or tingling in hands and feet. No thromboembolic events since his last visit. He notes intermittent hematochezia from internal hemorrhoids.  He denies melena, hematemesis, epistaxis, or hematuria. Denies recent chest pain on exertion, shortness of breath on minimal exertion, pre-syncopal episodes, or palpitations. Denies any recent fevers, infections, or recent hospitalizations. No new masses or lymphadenopathy per his report. Patient reports appetite at 75% and energy level at 100%. He is maintaining stable weight at this time.  His past medical history is otherwise significant for internal hemmorhoids, and  venous insufficiency s/p surgical correction.  Patient works full-time as a Tree surgeon.  He has prior exposure to pesticides, but reports that for the past 15 years, his produce has been organic and chemical/pesticide-free.  He smokes 1.5-2 PPD cigarettes for the past 30 years, is not currently interested in quitting.  He drinks occasional alcohol, about 1-2 drinks per week. He denies drug use.  Patient's mother has lung cancer.  His maternal side of the family had "cancer in the family" but he is unsure what type.   MEDICAL HISTORY:  Past Medical History:  Diagnosis Date  . Gout     SURGICAL HISTORY: Past Surgical History:  Procedure Laterality Date  . TONSILLECTOMY    . TOOTH EXTRACTION      SOCIAL HISTORY: Social History   Socioeconomic History  . Marital status: Single    Spouse name: Not on file  . Number of children: Not on file  . Years of education: Not on file  . Highest education level: Not on file  Occupational History  . Not on file  Tobacco Use  . Smoking status: Current Every Day Smoker    Types: Cigarettes  . Smokeless tobacco: Never Used  Substance and Sexual Activity  . Alcohol use: Yes  . Drug use: No  . Sexual activity: Not on file  Other Topics Concern  . Not on file  Social History Narrative  . Not on file   Social Determinants of Health   Financial Resource Strain: Not on file  Food Insecurity: Not on file  Transportation Needs: Not on file  Physical Activity: Not on file  Stress: Not on file  Social Connections: Not on file  Intimate Partner Violence: Not on file    FAMILY HISTORY: Family History  Problem Relation Age of Onset  . Cancer Mother   . Asthma Father     ALLERGIES:  is allergic to aleve [naproxen sodium].  MEDICATIONS:  Current Outpatient Medications  Medication Sig Dispense Refill  . acetaminophen (TYLENOL) 500 MG tablet Take 500 mg by mouth every 6 (six) hours as needed.    . ALPRAZolam (XANAX) 0.5 MG tablet    0  . doxycycline (VIBRAMYCIN) 100 MG capsule Take 1 capsule (100 mg total) by mouth 2 (two) times daily. (Patient not taking: Reported on 03/10/2017) 14 capsule 0  . gabapentin (NEURONTIN) 300 MG capsule TAKE 1 CAPSULE BY MOUTH THREE TIMES A DAY  1  . HYDROcodone-acetaminophen (NORCO) 7.5-325 MG per tablet Take 1 tablet by mouth every 4 (four) hours as needed for pain. (Patient not taking: Reported on 03/10/2017) 20 tablet 0  . naproxen sodium (ALEVE) 220 MG tablet Take 440 mg by mouth once as needed (for headache pain).    Marland Kitchen penicillin v potassium (VEETID) 500 MG tablet 2 PO BID WITH FOOD (Patient not taking: Reported on 03/10/2017) 28 tablet 0  . traMADol (ULTRAM) 50 MG tablet TAKE 1 TABLET BY MOUTH 3 TIMES A DAY X 7 DAYS  0   No current facility-administered medications for this visit.    REVIEW OF SYSTEMS:   Review of Systems  Constitutional: Negative for appetite change, chills, diaphoresis, fatigue, fever and unexpected weight change.  HENT:   Negative for lump/mass and nosebleeds.        Seasonal allergies  Eyes: Negative for eye problems.  Respiratory: Negative for cough, hemoptysis and shortness of breath.   Cardiovascular: Negative for chest pain, leg swelling and palpitations.  Gastrointestinal: Positive for blood in stool (Internal hemorrhoids). Negative for abdominal pain, constipation, diarrhea, nausea and vomiting.  Genitourinary: Negative for hematuria.   Skin: Negative.   Neurological: Negative for dizziness, headaches and light-headedness.  Hematological: Does not bruise/bleed easily.      PHYSICAL EXAMINATION: ECOG PERFORMANCE STATUS: 0 - Asymptomatic  There were no vitals filed for this visit. There were no vitals filed for this visit.  Physical Exam Constitutional:      Appearance: Normal appearance. He is obese.  HENT:     Head: Normocephalic and atraumatic.     Mouth/Throat:     Mouth: Mucous membranes are moist.  Eyes:     Extraocular Movements:  Extraocular movements intact.     Pupils: Pupils are equal, round, and reactive to light.  Cardiovascular:     Rate and Rhythm: Normal rate and regular rhythm.     Pulses: Normal pulses.     Heart sounds: Normal heart sounds.  Pulmonary:     Effort: Pulmonary effort is normal.     Breath sounds: Normal breath sounds.  Abdominal:     General: Bowel sounds are normal.     Palpations: Abdomen is soft.     Tenderness: There is no abdominal tenderness.  Musculoskeletal:        General: No swelling.     Right lower leg: No edema.     Left lower leg: No edema.  Lymphadenopathy:     Cervical: No cervical adenopathy.  Skin:    General: Skin is warm and dry.  Neurological:     General: No focal deficit present.     Mental Status: He is alert and oriented to person, place, and time.  Psychiatric:  Mood and Affect: Mood normal.        Behavior: Behavior normal.       LABORATORY DATA:  I have reviewed the data as listed No results found for this or any previous visit (from the past 2160 hour(s)).  RADIOGRAPHIC STUDIES: I have personally reviewed the radiological images as listed and agreed with the findings in the report. No results found.  ASSESSMENT:  1.  Leukocytosis of undetermined cause -Labs sent by PCP show that the patient has had mildly elevated WBC for at least the past 2 years with WBC 11.0 with normal differential (08/07/2020), and WBC 12.0 with slight neutrophilia 7.5 (10/15/2018). -No B symptoms -Consider possible reactive leukocytosis due to high level of tobacco use (smokes 2 PPD cigarettes)  2.  Elevated blood pressure -Blood pressure in clinic visit was 171/95 -Patient reports that he sometimes has higher blood pressure when visiting the doctor -He has never been diagnosed with hypertension and does not take antihypertensives -Informed patient to follow-up with his primary care provider to determine if he has a chronic hypertension needing treatment  3.  Smoking -Patient smokes 1.5 to 2 packs/day cigarettes for the past 30 years -Extensively discussed risks of smoking with the patient, including but not limited to lung cancer (especially since his mother also has lung cancer), COPD, and increased risk of heart attack or stroke -Patient is not currently interested in quitting smoking, but knows that he can contact us or his primary care provider for support when he decides to quit  4. Family and social history -His past medical history is otherwise significant for internal hemmorhoids, and venous insufficiency s/p surgical correction. -Patient works full-time as a Tree surgeon.  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 -He smokes about 2 PPD cigarettes for the past 30 years, is not currently interested in quitting.  He drinks occasional alcohol, about 1-2 drinks per week. He denies drug use. -Patient's mother has lung cancer.  His maternal side of the family had "cancer in the family" but he is unsure what type.   PLAN:  1.  Leukocytosis of undetermined cause -Check CBC, BCR/ABL FISH, and JAK2 -Evaluate for underlying pro-inflammatory states with ESR, CRP, ANA, RF, and LDH -Follow-up appointment in 2 weeks to discuss results   PLAN SUMMARY & DISPOSITION: -Labs today -RTC in 2 to 3 weeks to discuss results and next steps  All questions were answered. The patient knows to call the clinic with any problems, questions or concerns.   Medical decision making: Moderate (1 new problem with unknown prognosis, review of external notes, review of prior test, ordering new test)  Time spent on visit: I spent 25 minutes counseling the patient face to face. The total time spent in the appointment was 45 minutes and more than 50% was on counseling.    I, Tarri Abernethy PA-C, have seen this patient in conjunction with Dr. Derek Jack. Greater than 50% of  visit was performed by Dr. Delton Coombes.  Addendum: I have independently evaluated this patient and agree with HPI written by Casey Burkitt, PA-C.  Patient seen at the request of Dr. Hilma Favors for elevated white blood cell count.  Patient is a current active smoker. No B symptoms.  Predominantly neutrophilic leukocytosis.  We will evaluate for myeloproliferative disorders.  Also check inflammatory markers and connective tissue disorders.  RTC 2 to 3 weeks to discuss results.   Harriett Rush,  PA-C 09/06/20 9:18 AM

## 2020-09-07 LAB — RHEUMATOID FACTOR: Rheumatoid fact SerPl-aCnc: <10 IU/mL (ref <14.0)

## 2020-09-07 LAB — ANA: Anti Nuclear Antibody (ANA): NEGATIVE

## 2020-09-14 LAB — BCR-ABL1 FISH
Cells Analyzed: 200
Cells Counted: 200

## 2020-09-14 LAB — JAK2 GENOTYPR

## 2020-09-26 ENCOUNTER — Other Ambulatory Visit: Payer: Self-pay

## 2020-09-26 ENCOUNTER — Inpatient Hospital Stay (HOSPITAL_COMMUNITY): Payer: BC Managed Care – PPO | Attending: Hematology | Admitting: Hematology

## 2020-09-26 VITALS — BP 149/90 | HR 82 | Temp 97.5°F | Resp 20 | Wt 245.3 lb

## 2020-09-26 DIAGNOSIS — E669 Obesity, unspecified: Secondary | ICD-10-CM | POA: Diagnosis not present

## 2020-09-26 DIAGNOSIS — F17210 Nicotine dependence, cigarettes, uncomplicated: Secondary | ICD-10-CM | POA: Insufficient documentation

## 2020-09-26 DIAGNOSIS — Z801 Family history of malignant neoplasm of trachea, bronchus and lung: Secondary | ICD-10-CM | POA: Diagnosis not present

## 2020-09-26 DIAGNOSIS — R030 Elevated blood-pressure reading, without diagnosis of hypertension: Secondary | ICD-10-CM | POA: Insufficient documentation

## 2020-09-26 DIAGNOSIS — D72829 Elevated white blood cell count, unspecified: Secondary | ICD-10-CM | POA: Diagnosis not present

## 2020-09-26 DIAGNOSIS — R5383 Other fatigue: Secondary | ICD-10-CM | POA: Insufficient documentation

## 2020-09-26 DIAGNOSIS — Z79899 Other long term (current) drug therapy: Secondary | ICD-10-CM | POA: Diagnosis not present

## 2020-09-26 NOTE — Progress Notes (Signed)
[place], [place] [address]   CLINIC:  Medical Oncology/Hematology  PCP:  Elsie Lincoln, [place] / [place] [address]  [phone]  REASON FOR VISIT:  Follow-up for leukocytosis  PRIOR THERAPY: None  CURRENT THERAPY: Observation  INTERVAL HISTORY:  [name], a 55 y.o. male, returns for routine follow-up for his leukocytosis. Jeter was last seen on 09/06/2020.  Today he reports feeling okay. He denies having any issues since his last visit. He denies taking an steroids recently. He reports that he had some problematic teeth which he addressed in January 2022.   REVIEW OF SYSTEMS:  Review of Systems  Constitutional: Positive for fatigue (50%). Negative for appetite change.  All other systems reviewed and are negative.   PAST MEDICAL/SURGICAL HISTORY:  Past Medical History:  Diagnosis Date  . Gout    Past Surgical History:  Procedure Laterality Date  . TONSILLECTOMY    . TOOTH EXTRACTION      SOCIAL HISTORY:  Social History   Socioeconomic History  . Marital status: Single    Spouse name: Not on file  . Number of children: Not on file  . Years of education: Not on file  . Highest education level: Not on file  Occupational History  . Not on file  Tobacco Use  . Smoking status: Current Every Day Smoker    Years: 30.00    Types: Cigarettes  . Smokeless tobacco: Never Used  Substance and Sexual Activity  . Alcohol use: Yes  . Drug use: No  . Sexual activity: Not on file  Other Topics Concern  . Not on file  Social History Narrative  . Not on file   Social Determinants of Health   Financial Resource Strain: Low Risk   . Difficulty of Paying Living Expenses: Not hard at all  Food Insecurity: No Food Insecurity  . Worried About Charity fundraiser in the Last Year: Never true  . Ran Out of Food in the Last Year: Never true  Transportation Needs: No Transportation Needs  . Lack of  Transportation (Medical): No  . Lack of Transportation (Non-Medical): No  Physical Activity: Sufficiently Active  . Days of Exercise per Week: 7 days  . Minutes of Exercise per Session: 30 min  Stress: No Stress Concern Present  . Feeling of Stress : Not at all  Social Connections: Moderately Isolated  . Frequency of Communication with Friends and Family: More than three times a week  . Frequency of Social Gatherings with Friends and Family: More than three times a week  . Attends Religious Services: 1 to 4 times per year  . Active Member of Clubs or Organizations: No  . Attends Archivist Meetings: Never  . Marital Status: Separated  Intimate Partner Violence: Not At Risk  . Fear of Current or Ex-Partner: No  . Emotionally Abused: No  . Physically Abused: No  . Sexually Abused: No    FAMILY HISTORY:  Family History  Problem Relation Age of Onset  . Cancer Mother   . Asthma Father     CURRENT MEDICATIONS:  Current Outpatient Medications  Medication Sig Dispense Refill  . acetaminophen (TYLENOL) 500 MG tablet Take 500 mg by mouth every 6 (six) hours as needed.     No current facility-administered medications for this visit.    ALLERGIES:  Allergies  Allergen Reactions  . Aleve [Naproxen Sodium] Itching    PHYSICAL EXAM:  Performance  status (ECOG): 0 - Asymptomatic  Vitals:   09/26/20 1200  BP: (!) 149/90  Pulse: 82  Resp: 20  Temp: (!) 97.5 F (36.4 C)  SpO2: 97%   Wt Readings from Last 3 Encounters:  09/26/20 245 lb 4.8 oz (111.3 kg)  11/18/17 222 lb (100.7 kg)  10/14/17 225 lb (102.1 kg)   Physical Exam Vitals reviewed.  Constitutional:      Appearance: Normal appearance. He is obese.  Neurological:     General: No focal deficit present.     Mental Status: He is alert and oriented to person, place, and time.  Psychiatric:        Mood and Affect: Mood normal.        Behavior: Behavior normal.     LABORATORY DATA:  I have reviewed the  labs as listed.  CBC Latest Ref Rng & Units 09/06/2020 03/06/2017  WBC 4.0 - 10.5 K/uL 7.8 10.1  Hemoglobin 13.0 - 17.0 g/dL 16.6 15.7  Hematocrit 39.0 - 52.0 % 50.3 45.0  Platelets 150 - 400 K/uL 233 229   CMP Latest Ref Rng & Units 03/06/2017  Glucose 65 - 99 mg/dL 100(H)  BUN 6 - 20 mg/dL 13  Creatinine 0.61 - 1.24 mg/dL 0.89  Sodium 135 - 145 mmol/L 135  Potassium 3.5 - 5.1 mmol/L 4.2  Chloride 101 - 111 mmol/L 105  CO2 22 - 32 mmol/L 22  Calcium 8.9 - 10.3 mg/dL 8.6(L)  Total Protein 6.5 - 8.1 g/dL 6.9  Total Bilirubin 0.3 - 1.2 mg/dL 0.6  Alkaline Phos 38 - 126 U/L 71  AST 15 - 41 U/L 29  ALT 17 - 63 U/L 45      Component Value Date/Time   RBC 5.37 09/06/2020 1122   MCV 93.7 09/06/2020 1122   MCH 30.9 09/06/2020 1122   MCHC 33.0 09/06/2020 1122   RDW 13.5 09/06/2020 1122   LYMPHSABS 2.1 09/06/2020 1122   MONOABS 0.8 09/06/2020 1122   EOSABS 0.3 09/06/2020 1122   BASOSABS 0.1 09/06/2020 1122   Lab Results  Component Value Date   LDH 127 09/06/2020    DIAGNOSTIC IMAGING:  I have independently reviewed the scans and discussed with the patient. No results found.   ASSESSMENT:  1.  Leukocytosis of undetermined cause: -Labs sent by PCP show that the patient has had mildly elevated WBC for at least the past 2 years with WBC 11.0 with normal differential (08/07/2020), and WBC 12.0 with slight neutrophilia 7.5 (10/15/2018). -No B symptoms -Consider possible reactive leukocytosis due to high level of tobacco use (smokes 2 PPD cigarettes)  2.  Elevated blood pressure: -Blood pressure in clinic visit was 171/95 -Patient reports that he sometimes has higher blood pressure when visiting the doctor -He has never been diagnosed with hypertension and does not take antihypertensives -Informed patient to follow-up with his primary care provider to determine if he has a chronic hypertension needing treatment  3. Smoking: -Patient smokes 1.5 to 2 packs/day cigarettes for the  past 30 years -Extensively discussed risks of smoking with the patient, including but not limited to lung cancer (especially since his mother also has lung cancer), COPD, and increased risk of heart attack or stroke -Patient is not currently interested in quitting smoking, but knows that he can contact us or his primary care provider for support when he decides to quit  4.  Family and social history: -His past medical history is otherwise significant for internal hemmorhoids,andvenous insufficiency s/p surgical correction. -Patient  works full-time as a Tree surgeon. 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 -He smokes about 2 PPD cigarettes for the past 30 years, is not currently interested in quitting.He drinks occasional alcohol,about1-2drinks perweek. He denies drug use. -Patient's mother has lung cancer.His maternal side of the family had "cancer in the family" buthe isunsure what type.   PLAN:  1.  Reactive leukocytosis: -We reviewed his repeat CBC from 09/06/2020.  White count normalized at 7.8 with normal differential. - JAK2 V617F and BCR/ABL test results were negative.  ANA and rheumatoid factor was negative.  ESR was normal and CRP was mildly elevated.  LDH was normal. - Likely smoking-related intermittent leukocytosis.  No B symptoms at this time.  Recommend follow-up in 6 months with repeat labs.  Orders placed this encounter:  No orders of the defined types were placed in this encounter.    Derek Jack, MD [place] [phone]   I, Milinda Antis, am acting as a scribe for Dr. Sanda Linger.  I, Derek Jack MD, have reviewed the above documentation for accuracy and completeness, and I agree with the above.

## 2020-09-26 NOTE — Patient Instructions (Signed)
[facility] at [facility] Discharge Instructions  You were seen today by Dr. Katragadda. He went over your recent results. Dr. Katragadda will see you back in 6 months for labs and follow up.   Thank you for choosing [facility] at [facility] to provide your oncology and hematology care.  To afford each patient quality time with our provider, please arrive at least 15 minutes before your scheduled appointment time.   If you have a lab appointment with the Cancer Center please come in thru the Main Entrance and check in at the main information desk  You need to re-schedule your appointment should you arrive 10 or more minutes late.  We strive to give you quality time with our providers, and arriving late affects you and other patients whose appointments are after yours.  Also, if you no show three or more times for appointments you may be dismissed from the clinic at the providers discretion.     Again, thank you for choosing [facility].  Our hope is that these requests will decrease the amount of time that you wait before being seen by our physicians.       _____________________________________________________________  Should you have questions after your visit to [facility], please contact our office at [phone] between the hours of 8:00 a.m. and 4:30 p.m.  Voicemails left after 4:00 p.m. will not be returned until the following business day.  For prescription refill requests, have your pharmacy contact our office and allow 72 hours.    Cancer Center Support Programs:   > Cancer Support Group  2nd Tuesday of the month 1pm-2pm, Journey Room    

## 2020-11-14 DIAGNOSIS — L280 Lichen simplex chronicus: Secondary | ICD-10-CM | POA: Diagnosis not present

## 2020-11-14 DIAGNOSIS — D485 Neoplasm of uncertain behavior of skin: Secondary | ICD-10-CM | POA: Diagnosis not present

## 2021-03-26 ENCOUNTER — Inpatient Hospital Stay (HOSPITAL_COMMUNITY): Payer: BC Managed Care – PPO | Attending: Hematology

## 2021-03-26 DIAGNOSIS — F17210 Nicotine dependence, cigarettes, uncomplicated: Secondary | ICD-10-CM | POA: Diagnosis not present

## 2021-03-26 DIAGNOSIS — D72829 Elevated white blood cell count, unspecified: Secondary | ICD-10-CM | POA: Diagnosis not present

## 2021-03-26 LAB — CBC WITH DIFFERENTIAL/PLATELET
Abs Immature Granulocytes: 0.1 10*3/uL — ABNORMAL HIGH (ref 0.00–0.07)
Basophils Absolute: 0.1 10*3/uL (ref 0.0–0.1)
Basophils Relative: 1 %
Eosinophils Absolute: 0.3 10*3/uL (ref 0.0–0.5)
Eosinophils Relative: 3 %
HCT: 50.2 % (ref 39.0–52.0)
Hemoglobin: 17.5 g/dL — ABNORMAL HIGH (ref 13.0–17.0)
Immature Granulocytes: 1 %
Lymphocytes Relative: 25 %
Lymphs Abs: 2.7 10*3/uL (ref 0.7–4.0)
MCH: 32.8 pg (ref 26.0–34.0)
MCHC: 34.9 g/dL (ref 30.0–36.0)
MCV: 94 fL (ref 80.0–100.0)
Monocytes Absolute: 0.9 10*3/uL (ref 0.1–1.0)
Monocytes Relative: 8 %
Neutro Abs: 6.7 10*3/uL (ref 1.7–7.7)
Neutrophils Relative %: 62 %
Platelets: 242 10*3/uL (ref 150–400)
RBC: 5.34 MIL/uL (ref 4.22–5.81)
RDW: 13.5 % (ref 11.5–15.5)
WBC: 10.9 10*3/uL — ABNORMAL HIGH (ref 4.0–10.5)
nRBC: 0 % (ref 0.0–0.2)

## 2021-03-26 LAB — LACTATE DEHYDROGENASE: LDH: 151 U/L (ref 98–192)

## 2021-03-31 NOTE — Progress Notes (Signed)
[facility] [address], [place] [address]   CLINIC:  Medical Oncology/Hematology  PCP:  Karleen Hampshire, MD [address] / [place] [address]  [phone]  REASON FOR VISIT:  Follow-up for leukocytosis  PRIOR THERAPY: none  CURRENT THERAPY: surveillance  INTERVAL HISTORY:  [name], a 55 y.o. male, returns for routine follow-up for his leukocytosis. Zyshonne was last seen on 09/26/2020.  Today he reports feeling good. He denies any recent infections, fever, and night sweats. His is still currently smoking, and he smokes 2 ppd.   REVIEW OF SYSTEMS:  Review of Systems  Constitutional:  Negative for appetite change, fatigue (80%) and fever.  All other systems reviewed and are negative.  PAST MEDICAL/SURGICAL HISTORY:  Past Medical History:  Diagnosis Date   Gout    Past Surgical History:  Procedure Laterality Date   TONSILLECTOMY     TOOTH EXTRACTION      SOCIAL HISTORY:  Social History   Socioeconomic History   Marital status: Single    Spouse name: Not on file   Number of children: Not on file   Years of education: Not on file   Highest education level: Not on file  Occupational History   Not on file  Tobacco Use   Smoking status: Every Day    Years: 30.00    Types: Cigarettes   Smokeless tobacco: Never  Substance and Sexual Activity   Alcohol use: Yes   Drug use: No   Sexual activity: Not on file  Other Topics Concern   Not on file  Social History Narrative   Not on file   Social Determinants of Health   Financial Resource Strain: Low Risk    Difficulty of Paying Living Expenses: Not hard at all  Food Insecurity: No Food Insecurity   Worried About Programme researcher, broadcasting/film/video in the Last Year: Never true   Ran Out of Food in the Last Year: Never true  Transportation Needs: No Transportation Needs   Lack of Transportation (Medical): No   Lack of Transportation (Non-Medical): No  Physical Activity:  Sufficiently Active   Days of Exercise per Week: 7 days   Minutes of Exercise per Session: 30 min  Stress: No Stress Concern Present   Feeling of Stress : Not at all  Social Connections: Moderately Isolated   Frequency of Communication with Friends and Family: More than three times a week   Frequency of Social Gatherings with Friends and Family: More than three times a week   Attends Religious Services: 1 to 4 times per year   Active Member of [employer] or Organizations: No   Attends Banker Meetings: Never   Marital Status: Separated  Intimate Partner Violence: Not At Risk   Fear of Current or Ex-Partner: No   Emotionally Abused: No   Physically Abused: No   Sexually Abused: No    FAMILY HISTORY:  Family History  Problem Relation Age of Onset   Cancer Mother    Asthma Father     CURRENT MEDICATIONS:  Current Outpatient Medications  Medication Sig Dispense Refill   acetaminophen (TYLENOL) 500 MG tablet Take 500 mg by mouth every 6 (six) hours as needed.     No current facility-administered medications for this visit.    ALLERGIES:  Allergies  Allergen Reactions   Aleve [Naproxen Sodium] Itching    PHYSICAL EXAM:  Performance status (ECOG): 0 - Asymptomatic  There were no vitals filed for this  visit. Wt Readings from Last 3 Encounters:  09/26/20 245 lb 4.8 oz (111.3 kg)  11/18/17 222 lb (100.7 kg)  10/14/17 225 lb (102.1 kg)   Physical Exam Vitals reviewed.  Constitutional:      Appearance: Normal appearance.  Cardiovascular:     Rate and Rhythm: Normal rate and regular rhythm.     Pulses: Normal pulses.     Heart sounds: Normal heart sounds.  Pulmonary:     Effort: Pulmonary effort is normal.     Breath sounds: Normal breath sounds.  Abdominal:     Palpations: Abdomen is soft. There is no hepatomegaly, splenomegaly or mass.     Tenderness: There is no abdominal tenderness.  Lymphadenopathy:     Cervical: No cervical adenopathy.     Right  cervical: No superficial cervical adenopathy.    Left cervical: No superficial cervical adenopathy.     Upper Body:     Right upper body: No supraclavicular, axillary or pectoral adenopathy.     Left upper body: No supraclavicular, axillary or pectoral adenopathy.  Neurological:     General: No focal deficit present.     Mental Status: He is alert and oriented to person, place, and time.  Psychiatric:        Mood and Affect: Mood normal.        Behavior: Behavior normal.    LABORATORY DATA:  I have reviewed the labs as listed.  CBC Latest Ref Rng & Units 03/26/2021 09/06/2020 03/06/2017  WBC 4.0 - 10.5 K/uL 10.9(H) 7.8 10.1  Hemoglobin 13.0 - 17.0 g/dL 17.5(H) 16.6 15.7  Hematocrit 39.0 - 52.0 % 50.2 50.3 45.0  Platelets 150 - 400 K/uL 242 233 229   CMP Latest Ref Rng & Units 03/06/2017  Glucose 65 - 99 mg/dL 100(H)  BUN 6 - 20 mg/dL 13  Creatinine 0.61 - 1.24 mg/dL 0.89  Sodium 135 - 145 mmol/L 135  Potassium 3.5 - 5.1 mmol/L 4.2  Chloride 101 - 111 mmol/L 105  CO2 22 - 32 mmol/L 22  Calcium 8.9 - 10.3 mg/dL 8.6(L)  Total Protein 6.5 - 8.1 g/dL 6.9  Total Bilirubin 0.3 - 1.2 mg/dL 0.6  Alkaline Phos 38 - 126 U/L 71  AST 15 - 41 U/L 29  ALT 17 - 63 U/L 45      Component Value Date/Time   RBC 5.34 03/26/2021 0807   MCV 94.0 03/26/2021 0807   MCH 32.8 03/26/2021 0807   MCHC 34.9 03/26/2021 0807   RDW 13.5 03/26/2021 0807   LYMPHSABS 2.7 03/26/2021 0807   MONOABS 0.9 03/26/2021 0807   EOSABS 0.3 03/26/2021 0807   BASOSABS 0.1 03/26/2021 0807    DIAGNOSTIC IMAGING:  I have independently reviewed the scans and discussed with the patient. No results found.   ASSESSMENT:  1.  Leukocytosis of undetermined cause: -Labs sent by PCP show that the patient has had mildly elevated WBC for at least the past 2 years with WBC 11.0 with normal differential (08/07/2020), and WBC 12.0 with slight neutrophilia 7.5 (10/15/2018). -No B symptoms -Consider possible reactive leukocytosis  due to high level of tobacco use (smokes 2 PPD cigarettes)   2.  Elevated blood pressure: -Blood pressure in clinic visit was 171/95 -Patient reports that he sometimes has higher blood pressure when visiting the doctor -He has never been diagnosed with hypertension and does not take antihypertensives -Informed patient to follow-up with his primary care provider to determine if he has a chronic hypertension needing treatment  3. Smoking: -Patient smokes 1.5 to 2 packs/day cigarettes for the past 30 years -Extensively discussed risks of smoking with the patient, including but not limited to lung cancer (especially since his mother also has lung cancer), COPD, and increased risk of heart attack or stroke -Patient is not currently interested in quitting smoking, but knows that he can contact us or his primary care provider for support when he decides to quit   4.  Family and social history: -His past medical history is otherwise significant for internal hemmorhoids, and venous insufficiency s/p surgical correction. -Patient works full-time as a Tree surgeon.  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 -He smokes about 2 PPD cigarettes for the past 30 years, is not currently interested in quitting.  He drinks occasional alcohol, about 1-2 drinks per week. He denies drug use. -Patient's mother has lung cancer.  His maternal side of the family had "cancer in the family" but he is unsure what type.   PLAN:  1.  JAK2 V617F and BCR/ABL negative reactive leukocytosis: - Etiology is likely smoking-related intermittent leukocytosis. - Reviewed labs from 03/26/2021 which showed white count 10.9 with normal differential.  Hemoglobin was also mildly elevated at 17.5.  LDH was 151. - He does not have any B symptoms or infections. - No palpable adenopathy or splenomegaly. - RTC 6 months for follow-up.  2.  Tobacco  abuse: - He is a current active smoker.  He will qualify for CT low-dose scan of the chest.  We discussed the pros and cons.  He is agreeable.  We will schedule it.  Orders placed this encounter:  No orders of the defined types were placed in this encounter.    Derek Jack, MD [place] [phone]   I, Thana Ates, am acting as a scribe for Dr. Derek Jack.  I, Derek Jack MD, have reviewed the above documentation for accuracy and completeness, and I agree with the above.

## 2021-04-02 ENCOUNTER — Other Ambulatory Visit: Payer: Self-pay

## 2021-04-02 ENCOUNTER — Inpatient Hospital Stay (HOSPITAL_COMMUNITY): Payer: BC Managed Care – PPO | Attending: Hematology | Admitting: Hematology

## 2021-04-02 VITALS — HR 109 | Temp 98.2°F | Resp 18

## 2021-04-02 DIAGNOSIS — R030 Elevated blood-pressure reading, without diagnosis of hypertension: Secondary | ICD-10-CM | POA: Diagnosis not present

## 2021-04-02 DIAGNOSIS — F17210 Nicotine dependence, cigarettes, uncomplicated: Secondary | ICD-10-CM | POA: Insufficient documentation

## 2021-04-02 DIAGNOSIS — Z801 Family history of malignant neoplasm of trachea, bronchus and lung: Secondary | ICD-10-CM | POA: Diagnosis not present

## 2021-04-02 DIAGNOSIS — Z122 Encounter for screening for malignant neoplasm of respiratory organs: Secondary | ICD-10-CM | POA: Diagnosis not present

## 2021-04-02 DIAGNOSIS — Z87891 Personal history of nicotine dependence: Secondary | ICD-10-CM

## 2021-04-02 DIAGNOSIS — D72829 Elevated white blood cell count, unspecified: Secondary | ICD-10-CM | POA: Diagnosis not present

## 2021-04-02 NOTE — Patient Instructions (Addendum)
[facility] at [facility] Discharge Instructions  You were seen and examined today by Dr. Ellin Saba. We will schedule you for low dose CT lung cancer screening scan. Our navigator, Lequita Halt, will call you with those results. Return as scheduled in 6 months for lab work and office visit.    Thank you for choosing [facility] at [facility] to provide your oncology and hematology care.  To afford each patient quality time with our provider, please arrive at least 15 minutes before your scheduled appointment time.   If you have a lab appointment with the Cancer Center please come in thru the Main Entrance and check in at the main information desk.  You need to re-schedule your appointment should you arrive 10 or more minutes late.  We strive to give you quality time with our providers, and arriving late affects you and other patients whose appointments are after yours.  Also, if you no show three or more times for appointments you may be dismissed from the clinic at the providers discretion.     Again, thank you for choosing [facility].  Our hope is that these requests will decrease the amount of time that you wait before being seen by our physicians.       _____________________________________________________________  Should you have questions after your visit to [facility], please contact our office at [phone] and follow the prompts.  Our office hours are 8:00 a.m. and 4:30 p.m. Monday - Friday.  Please note that voicemails left after 4:00 p.m. may not be returned until the following business day.  We are closed weekends and major holidays.  You do have access to a nurse 24-7, just call the main number to the clinic [phone] and do not press any options, hold on the line and a nurse will answer the phone.    For prescription refill requests, have your pharmacy contact our office and allow 72 hours.    Due to Covid,  you will need to wear a mask upon entering the hospital. If you do not have a mask, a mask will be given to you at the Main Entrance upon arrival. For doctor visits, patients may have 1 support person age 29 or older with them. For treatment visits, patients can not have anyone with them due to social distancing guidelines and our immunocompromised population.

## 2021-04-03 NOTE — Addendum Note (Signed)
Addended by: Vergia Alcon on: 04/03/2021 03:33 PM   Modules accepted: Orders

## 2021-04-26 ENCOUNTER — Ambulatory Visit: Payer: BC Managed Care – PPO

## 2021-05-29 ENCOUNTER — Other Ambulatory Visit: Payer: Self-pay

## 2021-05-29 ENCOUNTER — Ambulatory Visit
Admission: RE | Admit: 2021-05-29 | Discharge: 2021-05-29 | Disposition: A | Discharge status: Home / Self Care | Payer: BC Managed Care – PPO | Source: Ambulatory Visit | Attending: Hematology | Admitting: Hematology

## 2021-05-29 DIAGNOSIS — Z87891 Personal history of nicotine dependence: Secondary | ICD-10-CM | POA: Diagnosis not present

## 2021-05-29 DIAGNOSIS — Z122 Encounter for screening for malignant neoplasm of respiratory organs: Secondary | ICD-10-CM | POA: Insufficient documentation

## 2021-05-29 DIAGNOSIS — F17210 Nicotine dependence, cigarettes, uncomplicated: Secondary | ICD-10-CM | POA: Diagnosis not present

## 2021-07-03 ENCOUNTER — Encounter (HOSPITAL_COMMUNITY): Payer: Self-pay

## 2021-07-03 NOTE — Progress Notes (Signed)
Patient notified of LDCT Lung Cancer Screening Results via mail with the recommendation to follow-up in 12 months. Patient's referring provider has been sent a copy of results. Results are as follows:  IMPRESSION: 1. Lung-RADS 2S, benign appearance or behavior. Continue annual screening with low-dose chest CT without contrast in 12 months. S modifier for coronary artery calcifications. 2. Mild coronary artery calcifications of the LAD, recommend ASCVD risk assessment. 3. Aortic Atherosclerosis (ICD10-I70.0) and Emphysema (ICD10-J43.9).   

## 2021-09-28 ENCOUNTER — Inpatient Hospital Stay (HOSPITAL_COMMUNITY): Payer: BC Managed Care – PPO | Attending: Hematology

## 2021-09-28 DIAGNOSIS — Z801 Family history of malignant neoplasm of trachea, bronchus and lung: Secondary | ICD-10-CM | POA: Diagnosis not present

## 2021-09-28 DIAGNOSIS — F17210 Nicotine dependence, cigarettes, uncomplicated: Secondary | ICD-10-CM | POA: Diagnosis not present

## 2021-09-28 DIAGNOSIS — R918 Other nonspecific abnormal finding of lung field: Secondary | ICD-10-CM | POA: Diagnosis not present

## 2021-09-28 DIAGNOSIS — D72829 Elevated white blood cell count, unspecified: Secondary | ICD-10-CM | POA: Insufficient documentation

## 2021-09-28 DIAGNOSIS — K029 Dental caries, unspecified: Secondary | ICD-10-CM | POA: Insufficient documentation

## 2021-09-28 LAB — CBC WITH DIFFERENTIAL/PLATELET
Abs Immature Granulocytes: 0.06 10*3/uL (ref 0.00–0.07)
Basophils Absolute: 0.1 10*3/uL (ref 0.0–0.1)
Basophils Relative: 1 %
Eosinophils Absolute: 0.3 10*3/uL (ref 0.0–0.5)
Eosinophils Relative: 3 %
HCT: 48.4 % (ref 39.0–52.0)
Hemoglobin: 16.4 g/dL (ref 13.0–17.0)
Immature Granulocytes: 1 %
Lymphocytes Relative: 29 %
Lymphs Abs: 2.4 10*3/uL (ref 0.7–4.0)
MCH: 31.3 pg (ref 26.0–34.0)
MCHC: 33.9 g/dL (ref 30.0–36.0)
MCV: 92.4 fL (ref 80.0–100.0)
Monocytes Absolute: 0.6 10*3/uL (ref 0.1–1.0)
Monocytes Relative: 7 %
Neutro Abs: 5 10*3/uL (ref 1.7–7.7)
Neutrophils Relative %: 59 %
Platelets: 241 10*3/uL (ref 150–400)
RBC: 5.24 MIL/uL (ref 4.22–5.81)
RDW: 13.6 % (ref 11.5–15.5)
WBC: 8.4 10*3/uL (ref 4.0–10.5)
nRBC: 0 % (ref 0.0–0.2)

## 2021-09-28 LAB — LACTATE DEHYDROGENASE: LDH: 146 U/L (ref 98–192)

## 2021-10-04 NOTE — Progress Notes (Signed)
? ?[place] ?[address] ?[place], [place] [address] ? ? ?CLINIC:  ?Medical Oncology/Hematology ? ?PCP:  ?Elsie Lincoln, MD ?[place] ?[place] [address] ?[phone] ? ? ?REASON FOR VISIT:  ?Follow-up for leukocytosis ? ?PRIOR THERAPY: None ? ?CURRENT THERAPY: Surveillance ? ?INTERVAL HISTORY:  ?[name] 56 y.o. male returns for routine follow-up of his leukocytosis, which is suspected to be reactive secondary to heavy tobacco use.  He was last seen by Dr. Delton Coombes on 04/02/2022.  ? ?He is cutting back on how much he smokes, down to 1 PPD (from 2 PPD.  He denies any frequent infections, but has had some previous issues with dental caries.  He denies any B symptoms such as fever, chills, night sweats, unintentional weight loss.  He denies any new lumps or bumps.  He has not had any recent steroid medication. ?He has no complaints at today's visit. ?He has 50% energy and 40% appetite. He endorses that he is maintaining a stable weight. ? ? ?REVIEW OF SYSTEMS:  ?Review of Systems  ?Constitutional:  Positive for fatigue. Negative for appetite change, chills, diaphoresis, fever and unexpected weight change.  ?HENT:   Negative for lump/mass and nosebleeds.   ?Eyes:  Negative for eye problems.  ?Respiratory:  Negative for cough, hemoptysis and shortness of breath.   ?Cardiovascular:  Negative for chest pain, leg swelling and palpitations.  ?Gastrointestinal:  Negative for abdominal pain, blood in stool, constipation, diarrhea, nausea and vomiting.  ?Genitourinary:  Negative for hematuria.   ?Skin: Negative.   ?Neurological:  Negative for dizziness, headaches and light-headedness.  ?Hematological:  Does not bruise/bleed easily.   ? ? ?PAST MEDICAL/SURGICAL HISTORY:  ?Past Medical History:  ?Diagnosis Date  ? Gout   ? ?Past Surgical History:  ?Procedure Laterality Date  ? TONSILLECTOMY    ? TOOTH EXTRACTION    ? ? ? ?SOCIAL HISTORY:  ?Social History  ? ?Socioeconomic History  ? Marital  status: Single  ?  Spouse name: Not on file  ? Number of children: Not on file  ? Years of education: Not on file  ? Highest education level: Not on file  ?Occupational History  ? Not on file  ?Tobacco Use  ? Smoking status: Every Day  ?  Years: 30.00  ?  Types: Cigarettes  ? Smokeless tobacco: Never  ?Substance and Sexual Activity  ? Alcohol use: Yes  ? Drug use: No  ? Sexual activity: Not on file  ?Other Topics Concern  ? Not on file  ?Social History Narrative  ? Not on file  ? ?Social Determinants of Health  ? ?Financial Resource Strain: Not on file  ?Food Insecurity: Not on file  ?Transportation Needs: Not on file  ?Physical Activity: Not on file  ?Stress: Not on file  ?Social Connections: Not on file  ?Intimate Partner Violence: Not on file  ? ? ?FAMILY HISTORY:  ?Family History  ?Problem Relation Age of Onset  ? Cancer Mother   ? Asthma Father   ? ? ?CURRENT MEDICATIONS:  ?Outpatient Encounter Medications as of 10/05/2021  ?Medication Sig  ? acetaminophen (TYLENOL) 500 MG tablet Take 500 mg by mouth every 6 (six) hours as needed. (Patient not taking: Reported on 04/02/2021)  ? ?No facility-administered encounter medications on file as of 10/05/2021.  ? ? ?ALLERGIES:  ?Allergies  ?Allergen Reactions  ? Aleve [Naproxen Sodium] Itching  ? ? ? ?PHYSICAL EXAM:  ?ECOG PERFORMANCE STATUS: 0 - Asymptomatic ? ?There were no vitals filed  for this visit. ?There were no vitals filed for this visit. ?Physical Exam ?Vitals reviewed.  ?Constitutional:   ?   Appearance: Normal appearance. He is obese.  ?Cardiovascular:  ?   Rate and Rhythm: Normal rate and regular rhythm.  ?   Pulses: Normal pulses.  ?   Heart sounds: Normal heart sounds.  ?Pulmonary:  ?   Effort: Pulmonary effort is normal.  ?   Breath sounds: Normal breath sounds.  ?Abdominal:  ?   Palpations: Abdomen is soft. There is no hepatomegaly, splenomegaly or mass.  ?   Tenderness: There is no abdominal tenderness.  ?Lymphadenopathy:  ?   Cervical: No cervical  adenopathy.  ?   Right cervical: No superficial cervical adenopathy. ?   Left cervical: No superficial cervical adenopathy.  ?   Upper Body:  ?   Right upper body: No supraclavicular, axillary or pectoral adenopathy.  ?   Left upper body: No supraclavicular, axillary or pectoral adenopathy.  ?Neurological:  ?   General: No focal deficit present.  ?   Mental Status: He is alert and oriented to person, place, and time.  ?Psychiatric:     ?   Mood and Affect: Mood normal.     ?   Behavior: Behavior normal.  ? ? ? ?LABORATORY DATA:  ?I have reviewed the labs as listed.  ?CBC ?   ?Component Value Date/Time  ? WBC 8.4 09/28/2021 0836  ? RBC 5.24 09/28/2021 0836  ? HGB 16.4 09/28/2021 0836  ? HCT 48.4 09/28/2021 0836  ? PLT 241 09/28/2021 0836  ? MCV 92.4 09/28/2021 0836  ? MCH 31.3 09/28/2021 0836  ? MCHC 33.9 09/28/2021 0836  ? RDW 13.6 09/28/2021 0836  ? LYMPHSABS 2.4 09/28/2021 0836  ? MONOABS 0.6 09/28/2021 0836  ? EOSABS 0.3 09/28/2021 0836  ? BASOSABS 0.1 09/28/2021 0836  ? ? ?  Latest Ref Rng & Units 03/06/2017  ?  3:39 PM  ?CMP  ?Glucose 65 - 99 mg/dL 100    ?BUN 6 - 20 mg/dL 13    ?Creatinine 0.61 - 1.24 mg/dL 0.89    ?Sodium 135 - 145 mmol/L 135    ?Potassium 3.5 - 5.1 mmol/L 4.2    ?Chloride 101 - 111 mmol/L 105    ?CO2 22 - 32 mmol/L 22    ?Calcium 8.9 - 10.3 mg/dL 8.6    ?Total Protein 6.5 - 8.1 g/dL 6.9    ?Total Bilirubin 0.3 - 1.2 mg/dL 0.6    ?Alkaline Phos 38 - 126 U/L 71    ?AST 15 - 41 U/L 29    ?ALT 17 - 63 U/L 45    ? ? ?DIAGNOSTIC IMAGING:  ?I have independently reviewed the relevant imaging and discussed with the patient. ? ?ASSESSMENT & PLAN: ?1.  Reactive leukocytosis ?- Labs sent by PCP show that the patient has had mildly elevated WBC for at least the past 2 years with WBC 11.0 with normal differential (08/07/2020), and WBC 12.0 with slight neutrophilia 7.5 (10/15/2018). ?- No B symptoms  ?-- He has intermittent dental carries without infection ?-- No palpable lypmphadenopathy or splenomegaly ?-  JAK2 V617F and BCR/ABL test results were negative.  ANA and rheumatoid factor was negative.  ESR was normal and CRP was mildly elevated.  LDH was normal. ?-- Most recent CBC (09/28/2021) normal WBC 8.4, normal differential.  LDH normal.  ?- Etiology is likely smoking-related intermittent leukocytosis. ?- PLAN: WBC has been stable over the past year.  MPN  workup was negative.  We will discharge to PCP at this time, but patient can be referred back to [place] in the future if he develop any leukocytosis with WBC > 20,000. ? ?2.  Tobacco use ?- Patient smokes 1.5 to 2 packs/day cigarettes for the past 30 years ?- Extensively discussed risks of smoking with the patient, including but not limited to lung cancer (especially since his mother also has lung cancer), COPD, and increased risk of heart attack or stroke ?- Patient is not currently interested in quitting smoking, but knows that he can contact us or his primary care provider for support when he decides to quit ?-- LDCT Chest (05/29/2021):  Lung-RADS2 benign with small solid pulmonary nodules ?- PLAN: Continue annual LDCT chest with lung cancer screening program ? ?3.  Other history ?- His past medical history is otherwise significant for internal hemmorhoids, and venous insufficiency s/p surgical correction. ?- Patient works full-time as a Tree surgeon.  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 ?- He smokes about 2 PPD cigarettes for the past 30 years, is not currently interested in quitting.  He drinks occasional alcohol, about 1-2 drinks per week. He denies drug use. ?- Patient's mother has lung cancer.  His maternal side of the family had "cancer in the family" but he is unsure what type. ? ? ? ?All questions were answered. The patient knows to call the clinic with any problems, questions or concerns. ? ?Medical decision making: Low ? ?Time spent on visit: I spent 15 minutes  counseling the patient face to face. The total time spent in the appointment was 20 minutes and more than 50% was on counseling. ? ? ?Harriett Rush, PA-C  ?10/05/2021 9:10 AM ? ? ?

## 2021-10-05 ENCOUNTER — Inpatient Hospital Stay (HOSPITAL_COMMUNITY): Payer: BC Managed Care – PPO | Admitting: Physician Assistant

## 2021-10-05 VITALS — BP 151/86 | HR 95 | Temp 98.1°F | Resp 16 | Wt 244.5 lb

## 2021-10-05 DIAGNOSIS — K029 Dental caries, unspecified: Secondary | ICD-10-CM | POA: Diagnosis not present

## 2021-10-05 DIAGNOSIS — F17210 Nicotine dependence, cigarettes, uncomplicated: Secondary | ICD-10-CM | POA: Diagnosis not present

## 2021-10-05 DIAGNOSIS — D72829 Elevated white blood cell count, unspecified: Secondary | ICD-10-CM

## 2021-10-05 DIAGNOSIS — R918 Other nonspecific abnormal finding of lung field: Secondary | ICD-10-CM | POA: Diagnosis not present

## 2021-10-05 DIAGNOSIS — Z801 Family history of malignant neoplasm of trachea, bronchus and lung: Secondary | ICD-10-CM | POA: Diagnosis not present

## 2021-10-05 NOTE — Patient Instructions (Addendum)
[facility] at [facility] ?Discharge Instructions ? ?You were seen today by Rojelio Brenner PA-C for your elevated white blood cells.  Your white blood cells are currently normal, and we believe that the elevations you have had in the past are related to inflammation from your smoking.  (Keep up the great work and cutting back on your cigarettes!)  You do not need to continue to follow with [place] here at the [facility], but you should be referred back to [place] in the future if you have consistently elevated WBC greater than 20, or having elevations in your white blood cells even though you have quit smoking. ? ?Thank you for choosing [facility] at [facility] to provide your oncology and hematology care.  To afford each patient quality time with our provider, please arrive at least 15 minutes before your scheduled appointment time.  ? ?If you have a lab appointment with the Cancer Center please come in thru the Main Entrance and check in at the main information desk. ? ?You need to re-schedule your appointment should you arrive 10 or more minutes late.  We strive to give you quality time with our providers, and arriving late affects you and other patients whose appointments are after yours.  Also, if you no show three or more times for appointments you may be dismissed from the clinic at the providers discretion.     ?Again, thank you for choosing [facility]  Our hope is that these requests will decrease the amount of time that you wait before being seen by our physicians.       ?_____________________________________________________________ ? ?Should you have questions after your visit to [facility], please contact our office at [phone] and follow the prompts.  Our office hours are 8:00 a.m. and 4:30 p.m. Monday - Friday.  Please note that voicemails left after 4:00 p.m. may not be returned until the following business day.  We are  closed weekends and major holidays.  You do have access to a nurse 24-7, just call the main number to the clinic [phone] and do not press any options, hold on the line and a nurse will answer the phone.   ? ?For prescription refill requests, have your pharmacy contact our office and allow 72 hours.   ? ?Due to Covid, you will need to wear a mask upon entering the hospital. If you do not have a mask, a mask will be given to you at the Main Entrance upon arrival. For doctor visits, patients may have 1 support person age 36 or older with them. For treatment visits, patients can not have anyone with them due to social distancing guidelines and our immunocompromised population.  ? ? ? ?

## 2021-10-11 DIAGNOSIS — Z720 Tobacco use: Secondary | ICD-10-CM | POA: Diagnosis not present

## 2022-01-10 DIAGNOSIS — H524 Presbyopia: Secondary | ICD-10-CM | POA: Diagnosis not present

## 2022-03-27 DIAGNOSIS — Z0001 Encounter for general adult medical examination with abnormal findings: Secondary | ICD-10-CM | POA: Diagnosis not present

## 2022-03-27 DIAGNOSIS — Z6827 Body mass index (BMI) 27.0-27.9, adult: Secondary | ICD-10-CM | POA: Diagnosis not present

## 2022-03-27 DIAGNOSIS — E6609 Other obesity due to excess calories: Secondary | ICD-10-CM | POA: Diagnosis not present

## 2022-03-27 DIAGNOSIS — Z1331 Encounter for screening for depression: Secondary | ICD-10-CM | POA: Diagnosis not present

## 2022-05-30 ENCOUNTER — Other Ambulatory Visit: Payer: Self-pay

## 2022-05-30 DIAGNOSIS — Z87891 Personal history of nicotine dependence: Secondary | ICD-10-CM

## 2022-05-30 DIAGNOSIS — Z122 Encounter for screening for malignant neoplasm of respiratory organs: Secondary | ICD-10-CM

## 2022-05-30 NOTE — Progress Notes (Deleted)
Patient notified of LDCT Lung Cancer Screening Results via mail with the recommendation to follow-up in 12 months. Patient's referring provider has been sent a copy of results. Results are as follows:  IMPRESSION: 1. Lung-RADS 2S, benign appearance or behavior. Continue annual screening with low-dose chest CT without contrast in 12 months. S modifier for coronary artery calcifications. 2. Mild coronary artery calcifications of the LAD, recommend ASCVD risk assessment. 3. Aortic Atherosclerosis (ICD10-I70.0) and Emphysema (ICD10-J43.9).

## 2022-05-30 NOTE — Progress Notes (Signed)
Order placed per protocol for LDCT. Patient scheduled for 2/6 at 1130.

## 2022-06-04 DIAGNOSIS — Z1211 Encounter for screening for malignant neoplasm of colon: Secondary | ICD-10-CM | POA: Diagnosis not present

## 2022-06-04 DIAGNOSIS — Z1212 Encounter for screening for malignant neoplasm of rectum: Secondary | ICD-10-CM | POA: Diagnosis not present

## 2022-07-02 ENCOUNTER — Ambulatory Visit (HOSPITAL_COMMUNITY)
Admission: RE | Admit: 2022-07-02 | Discharge: 2022-07-02 | Disposition: A | Discharge status: Home / Self Care | Payer: BC Managed Care – PPO | Source: Ambulatory Visit | Attending: Physician Assistant | Admitting: Physician Assistant

## 2022-07-02 DIAGNOSIS — F17210 Nicotine dependence, cigarettes, uncomplicated: Secondary | ICD-10-CM | POA: Diagnosis not present

## 2022-07-02 DIAGNOSIS — Z122 Encounter for screening for malignant neoplasm of respiratory organs: Secondary | ICD-10-CM

## 2022-07-02 DIAGNOSIS — Z87891 Personal history of nicotine dependence: Secondary | ICD-10-CM | POA: Insufficient documentation

## 2022-07-03 ENCOUNTER — Encounter: Payer: Self-pay | Admitting: *Deleted

## 2022-07-03 ENCOUNTER — Other Ambulatory Visit: Payer: Self-pay

## 2022-07-03 DIAGNOSIS — Z122 Encounter for screening for malignant neoplasm of respiratory organs: Secondary | ICD-10-CM

## 2022-07-03 DIAGNOSIS — Z87891 Personal history of nicotine dependence: Secondary | ICD-10-CM

## 2022-07-03 NOTE — Progress Notes (Signed)
Received an abnormal call report on low dose CT lung.  Results sent to Dr. Delton Coombes and Tarri Abernethy PA-C.

## 2022-07-03 NOTE — Progress Notes (Signed)
Patient notified of LDCT Lung Cancer Screening Results via telephone with the recommendation to follow-up in 3 months. Patient's referring provider has been sent a copy of results. Results are as follows:   IMPRESSION: 1. New irregular solid nodule of the left upper lobe measuring 7.2 mm.Lung-RADS 4A, suspicious. Follow up low-dose chest CT without contrast in 3 months (please use the following order, CT CHEST LCS NODULE FOLLOW-UP W/O CM) is recommended. Alternatively, PET may be considered when there is a solid component 20mm or larger. 2. Aortic Atherosclerosis (ICD10-I70.0) and Emphysema (ICD10-J43.9)

## 2022-07-03 NOTE — Progress Notes (Signed)
Order placed for LCS nodule follow-up. Follow-up scheduled for 05/06 at 0900.

## 2022-07-14 ENCOUNTER — Encounter: Payer: Self-pay | Admitting: Emergency Medicine

## 2022-07-14 ENCOUNTER — Ambulatory Visit
Admission: EM | Admit: 2022-07-14 | Discharge: 2022-07-14 | Disposition: A | Discharge status: Home / Self Care | Payer: BC Managed Care – PPO | Attending: Nurse Practitioner | Admitting: Nurse Practitioner

## 2022-07-14 DIAGNOSIS — R220 Localized swelling, mass and lump, head: Secondary | ICD-10-CM

## 2022-07-14 MED ORDER — DEXAMETHASONE SODIUM PHOSPHATE 10 MG/ML IJ SOLN
10.0000 mg | INTRAMUSCULAR | Status: AC
  Administered 2022-07-14: 10 mg via INTRAMUSCULAR

## 2022-07-14 NOTE — ED Provider Notes (Signed)
[facility]    CSN: [id number] Arrival date & time: 07/14/22  0845      History   Chief Complaint No chief complaint on file.   HPI Michael Pruitt is a 57 y.o. male.   The history is provided by the patient.   The patient presents for complaints of swelling to the left face that started over the past 2 to 3 days.  Patient states the area starts around the left side of his nose, and extends into the jaw.  He states that the area has become more tender and the area is firm.  He states that the area also feels like it extends into his "gums".  Patient denies fever, chills, nasal congestion, runny nose, cough, chest pain, abdominal pain, nausea, vomiting, or diarrhea.  Patient denies injury or trauma to the left face.  Patient states he has been trying tramadol for his symptoms with minimal relief.   Past Medical History:  Diagnosis Date   Gout     Patient Active Problem List   Diagnosis Date Noted   Chronic venous insufficiency 03/24/2017   Tobacco use disorder 03/10/2017   Varicose veins of both lower extremities with pain 03/10/2017    Past Surgical History:  Procedure Laterality Date   TONSILLECTOMY     TOOTH EXTRACTION         Home Medications    Prior to Admission medications   Medication Sig Start Date End Date Taking? Authorizing Provider  acetaminophen (TYLENOL) 500 MG tablet Take 500 mg by mouth every 6 (six) hours as needed.    [provider]    Family History Family History  Problem Relation Age of Onset   Cancer Mother    Asthma Father     Social History Social History   Tobacco Use   Smoking status: Every Day    Years: 30.00    Types: Cigarettes   Smokeless tobacco: Never  Substance Use Topics   Alcohol use: Yes   Drug use: No     Allergies   Aleve [naproxen sodium]   Review of Systems Review of Systems Per HPI  Physical Exam Triage Vital Signs ED Triage Vitals [07/14/22 0907]  Enc Vitals Group     BP (!)  161/94     Pulse Rate 66     Resp 18     Temp 97.7 F (36.5 C)     Temp Source Oral     SpO2 97 %     Weight      Height      Head Circumference      Peak Flow      Pain Score 8     Pain Loc      Pain Edu?      Excl. in [place]?    No data found.  Updated Vital Signs BP (!) 161/94 (BP Location: Right Arm)   Pulse 66   Temp 97.7 F (36.5 C) (Oral)   Resp 18   SpO2 97%   Visual Acuity Right Eye Distance:   Left Eye Distance:   Bilateral Distance:    Right Eye Near:   Left Eye Near:    Bilateral Near:     Physical Exam Vitals and nursing note reviewed.  Constitutional:      Appearance: Normal appearance.  HENT:     Head: Normocephalic.     Jaw: Tenderness and pain on movement present.     Salivary Glands: Left salivary gland is not  diffusely enlarged or tender.      Right Ear: Tympanic membrane, ear canal and external ear normal.     Left Ear: Tympanic membrane, ear canal and external ear normal.     Nose: Nasal tenderness present. No nasal deformity.     Left Nostril: No foreign body or epistaxis.     Left Turbinates: Enlarged.     Mouth/Throat:     Lips: Pink.     Mouth: Mucous membranes are moist.     Pharynx: Oropharynx is clear. Uvula midline. No pharyngeal swelling, oropharyngeal exudate or posterior oropharyngeal erythema.  Eyes:     Extraocular Movements: Extraocular movements intact.     Conjunctiva/sclera: Conjunctivae normal.     Pupils: Pupils are equal, round, and reactive to light.  Cardiovascular:     Rate and Rhythm: Normal rate and regular rhythm.     Pulses: Normal pulses.     Heart sounds: Normal heart sounds.  Pulmonary:     Effort: Pulmonary effort is normal. No respiratory distress.     Breath sounds: Normal breath sounds. No stridor. No wheezing, rhonchi or rales.  Abdominal:     General: Bowel sounds are normal.     Palpations: Abdomen is soft.     Tenderness: There is no abdominal tenderness.  Musculoskeletal:     Cervical back:  Normal range of motion.  Lymphadenopathy:     Cervical: No cervical adenopathy.  Skin:    General: Skin is warm and dry.  Neurological:     General: No focal deficit present.     Mental Status: He is alert and oriented to person, place, and time.  Psychiatric:        Mood and Affect: Mood normal.        Behavior: Behavior normal.      UC Treatments / Results  Labs (all labs ordered are listed, but only abnormal results are displayed) Labs Reviewed - No data to display  EKG   Radiology No results found.  Procedures Procedures (including critical care time)  Medications Ordered in UC Medications  dexamethasone (DECADRON) injection 10 mg (10 mg Intramuscular Given 07/14/22 0936)    Initial Impression / Assessment and Plan / UC Course  I have reviewed the triage vital signs and the nursing notes.  Pertinent labs & imaging results that were available during my care of the patient were reviewed by me and considered in my medical decision making (see chart for details).  The patient is well-appearing, he is in no acute distress, he is hypertensive, but vital signs are otherwise stable.  Patient with tenderness along the maxilla of the left face.  Differential diagnoses include acute sinusitis, allergic reaction, or soft tissue infection.  Decadron 10 mg IM was administered in the clinic.  Will start patient on Augmentin 875/125 mg tablets for possible sinusitis or to treat a soft tissue infection.  Patient was also prescribed prednisone 40 mg daily for the next 5 days.  Supportive care recommendations were provided to the patient to include warm compresses, over-the-counter analgesics such as Tylenol or ibuprofen, and to continue to monitor for worsening of symptoms.  Patient was given strict return precautions, and when follow-up in the emergency department may be indicated.  Patient verbalizes understanding.  All questions were answered.  Patient stable for discharge.   Final  Clinical Impressions(s) / UC Diagnoses   Final diagnoses:  None   Discharge Instructions   None    ED Prescriptions   None  PDMP not reviewed this encounter.   Tish Men, NP 07/14/22 [phone]

## 2022-07-14 NOTE — Discharge Instructions (Addendum)
You were given a steroid shot in the clinic today.  This should help with swelling and tenderness. Take medication as prescribed. Increase fluids and allow for plenty of rest. Warm compresses to the left face to help with pain or discomfort. Cool compresses to help with pain and swelling. As discussed, if symptoms do not improve with this treatment, I would like for you to follow-up with your primary care physician for further evaluation. Go to the emergency department immediately if you develop worsening facial swelling, pain, and if you develop fever, chills, difficulty breathing, or other concerns. Follow-up as needed.

## 2022-07-14 NOTE — ED Triage Notes (Signed)
Left facial swelling since Thursday.  States face feels tender.  Pain around sinus area that extends down to teeth.

## 2022-07-15 ENCOUNTER — Telehealth: Payer: Self-pay

## 2022-07-15 ENCOUNTER — Telehealth: Payer: Self-pay | Admitting: Family Medicine

## 2022-07-15 MED ORDER — PREDNISONE 20 MG PO TABS: 40.0000 mg | ORAL_TABLET | Freq: Every day | ORAL | 0 refills | Status: AC

## 2022-07-15 MED ORDER — AMOXICILLIN-POT CLAVULANATE 875-125 MG PO TABS: 1.0000 | ORAL_TABLET | Freq: Two times a day (BID) | ORAL | 0 refills | Status: AC

## 2022-07-15 NOTE — Telephone Encounter (Signed)
Medications sent, patient called and stated medications from [place] were never sent which appears correct per documentation

## 2022-07-15 NOTE — Telephone Encounter (Signed)
Patient called this morning stating his prescriptions were not sent to the pharmacy from his visit yesterday. Spoke with provider here and was able to get medication sent in for patient and called to notify patient they were sent.

## 2022-08-05 DIAGNOSIS — M5431 Sciatica, right side: Secondary | ICD-10-CM | POA: Diagnosis not present

## 2022-08-05 DIAGNOSIS — E663 Overweight: Secondary | ICD-10-CM | POA: Diagnosis not present

## 2022-08-05 DIAGNOSIS — Z6826 Body mass index (BMI) 26.0-26.9, adult: Secondary | ICD-10-CM | POA: Diagnosis not present

## 2022-08-05 DIAGNOSIS — M545 Low back pain, unspecified: Secondary | ICD-10-CM | POA: Diagnosis not present

## 2022-08-21 DIAGNOSIS — M9903 Segmental and somatic dysfunction of lumbar region: Secondary | ICD-10-CM | POA: Diagnosis not present

## 2022-08-21 DIAGNOSIS — M9905 Segmental and somatic dysfunction of pelvic region: Secondary | ICD-10-CM | POA: Diagnosis not present

## 2022-08-21 DIAGNOSIS — M5441 Lumbago with sciatica, right side: Secondary | ICD-10-CM | POA: Diagnosis not present

## 2022-08-21 DIAGNOSIS — M9902 Segmental and somatic dysfunction of thoracic region: Secondary | ICD-10-CM | POA: Diagnosis not present

## 2022-09-30 ENCOUNTER — Ambulatory Visit (HOSPITAL_COMMUNITY)
Admission: RE | Admit: 2022-09-30 | Discharge: 2022-09-30 | Disposition: A | Discharge status: Home / Self Care | Payer: BC Managed Care – PPO | Source: Ambulatory Visit | Attending: Physician Assistant | Admitting: Physician Assistant

## 2022-09-30 DIAGNOSIS — Z122 Encounter for screening for malignant neoplasm of respiratory organs: Secondary | ICD-10-CM | POA: Diagnosis not present

## 2022-09-30 DIAGNOSIS — J432 Centrilobular emphysema: Secondary | ICD-10-CM | POA: Diagnosis not present

## 2022-09-30 DIAGNOSIS — Z87891 Personal history of nicotine dependence: Secondary | ICD-10-CM | POA: Diagnosis not present

## 2022-09-30 DIAGNOSIS — R911 Solitary pulmonary nodule: Secondary | ICD-10-CM | POA: Diagnosis not present

## 2022-10-02 NOTE — Progress Notes (Signed)
Patient notified of LDCT Lung Cancer Screening Results via mail with the recommendation to follow-up in 12 months. Patient's referring provider has been sent a copy of results. Results are as follows:  ° °IMPRESSION: °1. Lung-RADS 2, benign appearance or behavior. Continue annual °screening with low-dose chest CT without contrast in 12 months. °2. Coronary artery calcifications. °3. Aortic Atherosclerosis (ICD10-I70.0) and Emphysema (ICD10-J43.9). °

## 2023-04-07 DIAGNOSIS — R7309 Other abnormal glucose: Secondary | ICD-10-CM | POA: Diagnosis not present

## 2023-04-07 DIAGNOSIS — E663 Overweight: Secondary | ICD-10-CM | POA: Diagnosis not present

## 2023-04-07 DIAGNOSIS — E538 Deficiency of other specified B group vitamins: Secondary | ICD-10-CM | POA: Diagnosis not present

## 2023-04-07 DIAGNOSIS — E559 Vitamin D deficiency, unspecified: Secondary | ICD-10-CM | POA: Diagnosis not present

## 2023-04-07 DIAGNOSIS — Z1331 Encounter for screening for depression: Secondary | ICD-10-CM | POA: Diagnosis not present

## 2023-04-07 DIAGNOSIS — Z0001 Encounter for general adult medical examination with abnormal findings: Secondary | ICD-10-CM | POA: Diagnosis not present

## 2023-04-07 DIAGNOSIS — Z125 Encounter for screening for malignant neoplasm of prostate: Secondary | ICD-10-CM | POA: Diagnosis not present

## 2023-04-07 DIAGNOSIS — D518 Other vitamin B12 deficiency anemias: Secondary | ICD-10-CM | POA: Diagnosis not present

## 2023-04-07 DIAGNOSIS — Z6826 Body mass index (BMI) 26.0-26.9, adult: Secondary | ICD-10-CM | POA: Diagnosis not present

## 2023-04-07 DIAGNOSIS — G9332 Myalgic encephalomyelitis/chronic fatigue syndrome: Secondary | ICD-10-CM | POA: Diagnosis not present

## 2023-04-17 DIAGNOSIS — R030 Elevated blood-pressure reading, without diagnosis of hypertension: Secondary | ICD-10-CM | POA: Diagnosis not present

## 2023-05-13 DIAGNOSIS — H524 Presbyopia: Secondary | ICD-10-CM | POA: Diagnosis not present

## 2023-07-02 ENCOUNTER — Other Ambulatory Visit: Payer: Self-pay

## 2023-07-02 DIAGNOSIS — Z122 Encounter for screening for malignant neoplasm of respiratory organs: Secondary | ICD-10-CM

## 2023-07-02 DIAGNOSIS — Z87891 Personal history of nicotine dependence: Secondary | ICD-10-CM

## 2023-09-30 ENCOUNTER — Ambulatory Visit (HOSPITAL_COMMUNITY)
Admission: RE | Admit: 2023-09-30 | Discharge: 2023-09-30 | Disposition: A | Discharge status: Home / Self Care | Payer: BC Managed Care – PPO | Source: Ambulatory Visit | Attending: Oncology | Admitting: Oncology

## 2023-09-30 DIAGNOSIS — Z122 Encounter for screening for malignant neoplasm of respiratory organs: Secondary | ICD-10-CM | POA: Insufficient documentation

## 2023-09-30 DIAGNOSIS — F17210 Nicotine dependence, cigarettes, uncomplicated: Secondary | ICD-10-CM | POA: Diagnosis not present

## 2023-09-30 DIAGNOSIS — Z87891 Personal history of nicotine dependence: Secondary | ICD-10-CM | POA: Insufficient documentation

## 2023-11-24 ENCOUNTER — Encounter: Payer: Self-pay | Admitting: *Deleted

## 2023-11-24 NOTE — Progress Notes (Signed)
Patient notified via mail of LDCT l ung cancer screening results with recommendations to follow up in 12 months.  Also notified of incidental findings and need to follow up with PCP.  Patient's referring provider was sent a copy of results.    IMPRESSION: Lung-RADS 2, benign appearance or behavior. Continue annual screening with low-dose chest CT without contrast in 12 months.  Aortic Atherosclerosis (ICD10-I70.0) and Emphysema (ICD10-J43.9).   

## 2023-11-29 IMAGING — CT CT CHEST LUNG CANCER SCREENING LOW DOSE W/O CM
2 of 4 series · 15 of 36 positions shown, 18 images · non-contrast
Comparison: None.

CLINICAL DATA: Current smoker with 30 pack-year history

EXAM:
CT CHEST WITHOUT CONTRAST LOW-DOSE FOR LUNG CANCER SCREENING
TECHNIQUE: Multidetector CT imaging of the chest was performed following the
standard protocol without IV contrast.

[Series 3: lungs · axial · 0.79mm/px · z∈[+825,+1136]mm · 12 of 343 slices shown, 15 images]
[im 16/343  mediastinal]
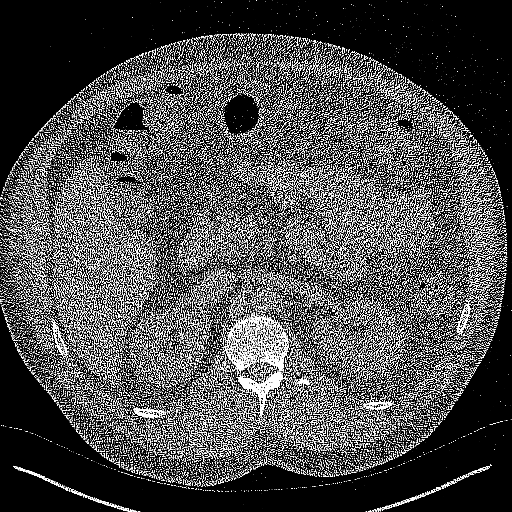
[im 16/343  lung]
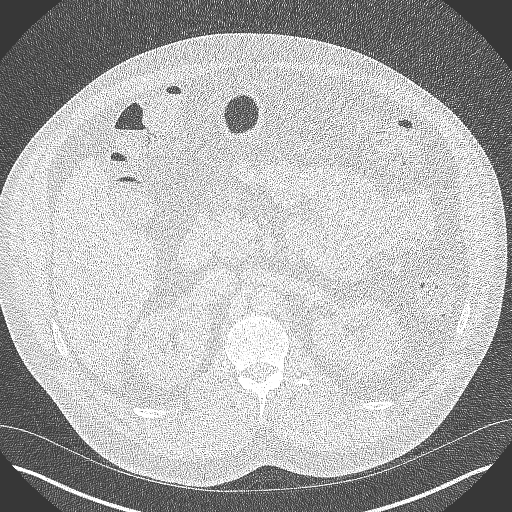
[im 47/343  lung]
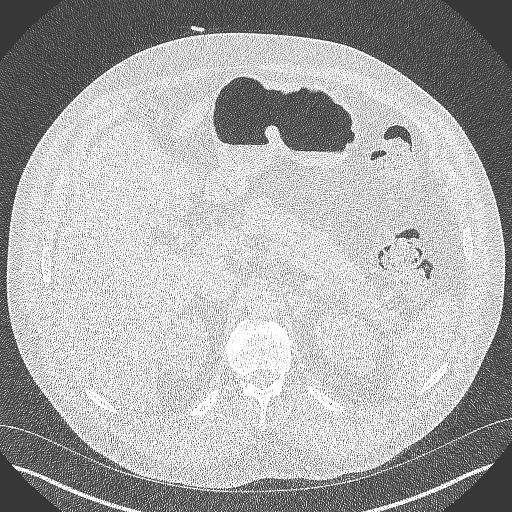
[im 78/343  lung]
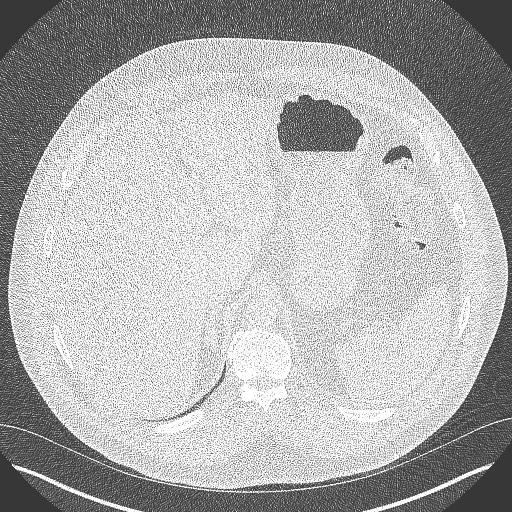
[im 109/343  lung]
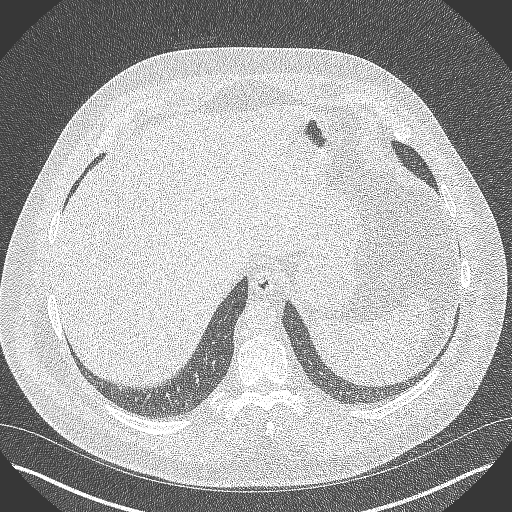
[im 125/343  mediastinal]
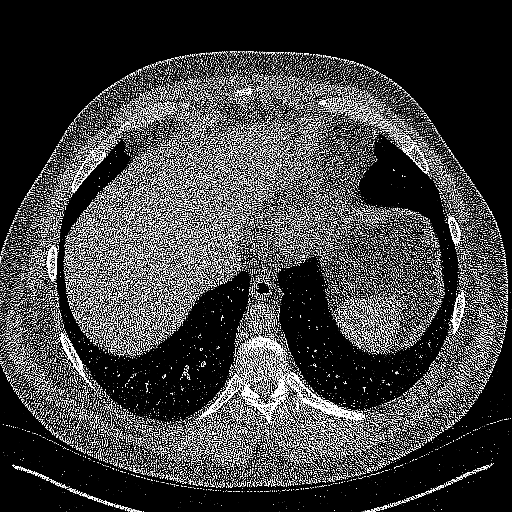
[im 125/343  lung]
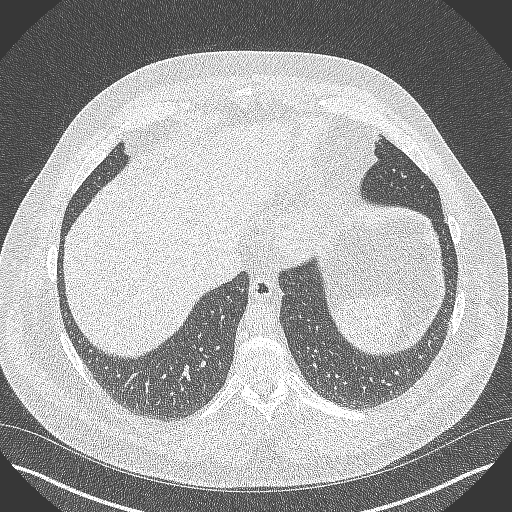
[im 156/343  lung]
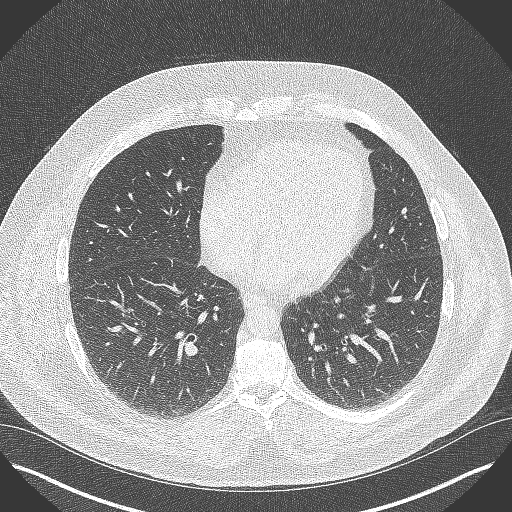
[im 187/343  lung]
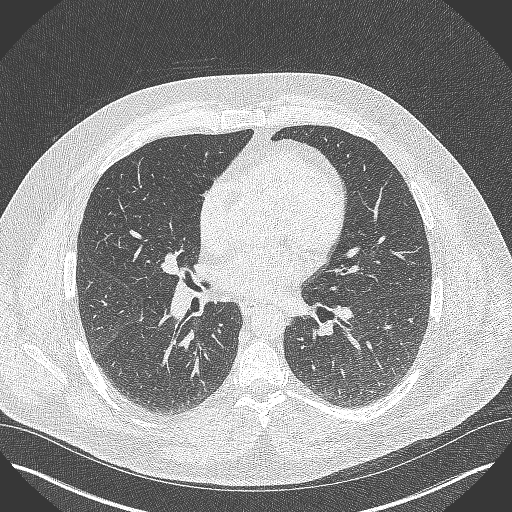
[im 218/343  lung]
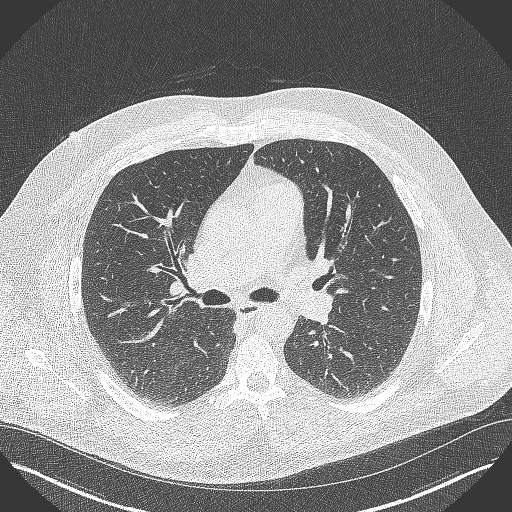
[im 234/343  mediastinal]
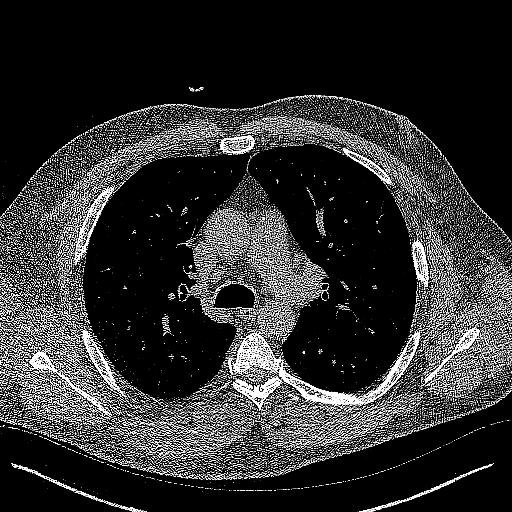
[im 234/343  lung]
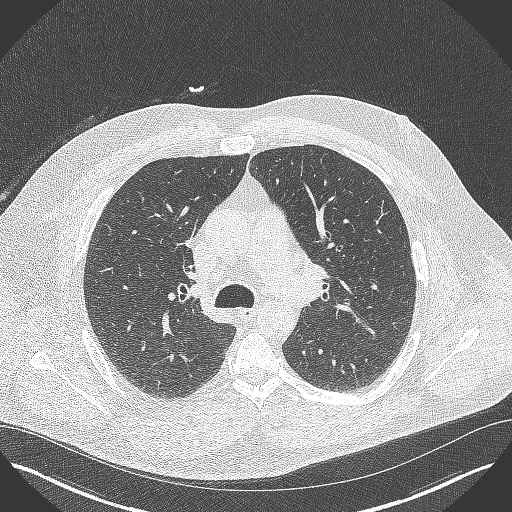
[im 265/343  lung]
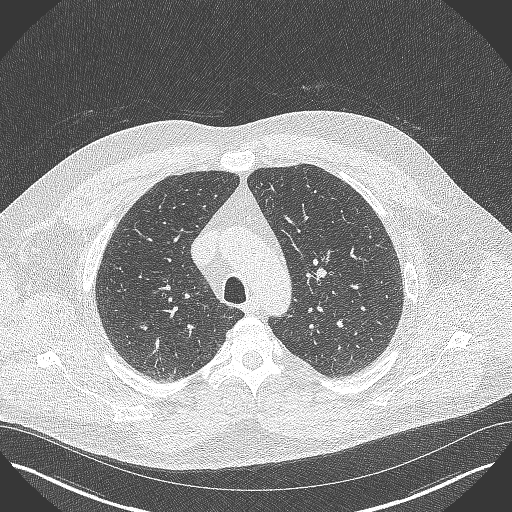
[im 296/343  lung]
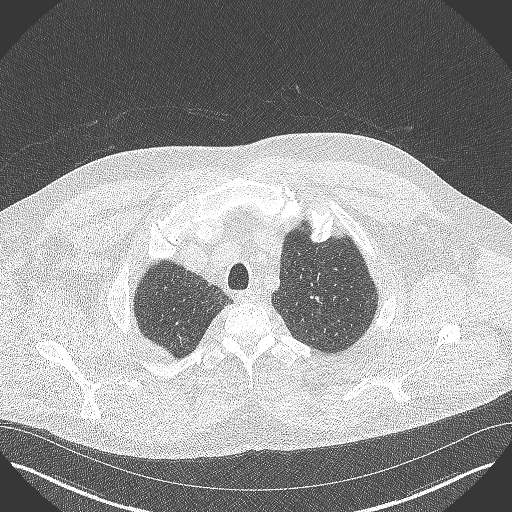
[im 327/343  lung]
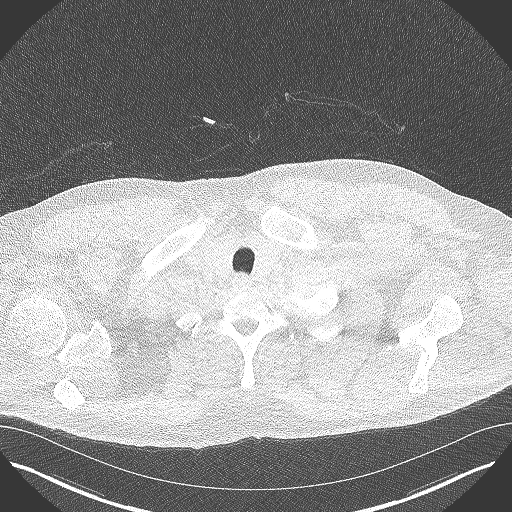

[Series 5: coronal · coronal · 0.70mm/px · 3 of 357 slices shown]
[im 72/357  lung]
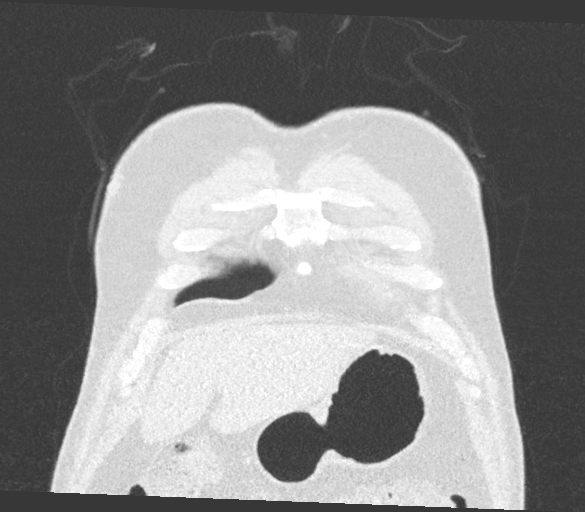
[im 143/357  lung]
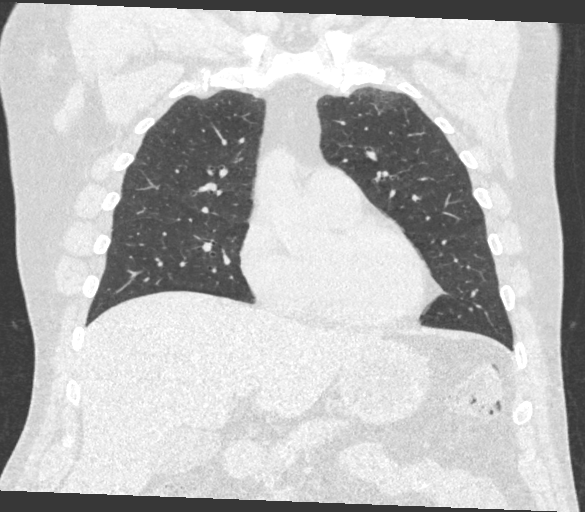
[im 214/357  lung]
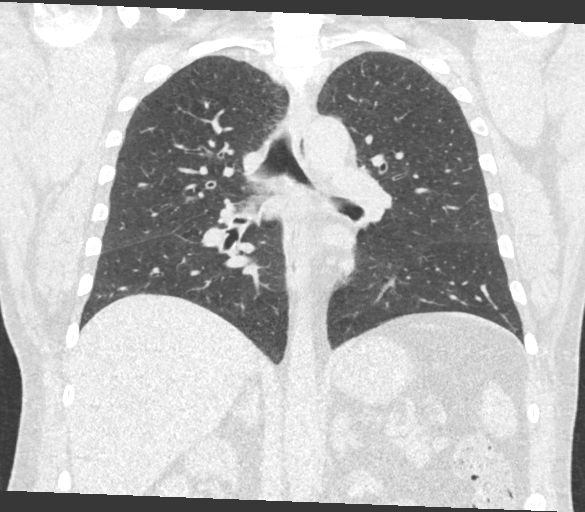

[15 of 36 positions shown; findings below may reference images not displayed]

FINDINGS: Cardiovascular: Normal heart size. No pericardial effusion. Mild
coronary artery calcifications of the LAD. Mild atherosclerotic
disease of the thoracic aorta.

Mediastinum/Nodes: Mildly enlarged subcarinal lymph node, measuring
to 1.3 cm on series 2, image 28, likely reactive. Esophagus is
unremarkable.

Lungs/Pleura: Central airways are patent. Centrilobular emphysema.
No consolidation, pleural effusion or pneumothorax. Small solid
pulmonary nodules. Reference nodule of the right lower lobe
measuring 4.7 mm on image 246.

Upper Abdomen: No acute abnormality.

Musculoskeletal: No chest wall mass or suspicious bone lesions
identified.
IMPRESSION: 1. Lung-RADS 2S, benign appearance or behavior. Continue annual
screening with low-dose chest CT without contrast in 12 months. S
modifier for coronary artery calcifications.
2. Mild coronary artery calcifications of the LAD, recommend ASCVD
risk assessment.
3. Aortic Atherosclerosis ([id number]) and Emphysema ([id number].E).

## 2024-06-04 ENCOUNTER — Other Ambulatory Visit: Payer: Self-pay | Admitting: *Deleted

## 2024-06-04 DIAGNOSIS — F17210 Nicotine dependence, cigarettes, uncomplicated: Secondary | ICD-10-CM

## 2024-06-04 DIAGNOSIS — Z87891 Personal history of nicotine dependence: Secondary | ICD-10-CM

## 2024-06-04 DIAGNOSIS — Z122 Encounter for screening for malignant neoplasm of respiratory organs: Secondary | ICD-10-CM
